# Patient Record
Sex: Female | Born: 1990 | Hispanic: Yes | Marital: Married | Smoking: Never smoker
Health system: Southern US, Community
[De-identification: ages and names within clinical notes are randomized; demographics above are authoritative.]

## PROBLEM LIST (undated history)

## (undated) DIAGNOSIS — N390 Urinary tract infection, site not specified: Secondary | ICD-10-CM

## (undated) DIAGNOSIS — Z789 Other specified health status: Secondary | ICD-10-CM

## (undated) HISTORY — PX: BREAST SURGERY: SHX581

---

## 2010-09-04 ENCOUNTER — Encounter (INDEPENDENT_AMBULATORY_CARE_PROVIDER_SITE_OTHER): Payer: Self-pay | Admitting: *Deleted

## 2010-09-04 ENCOUNTER — Ambulatory Visit: Payer: Self-pay | Admitting: Internal Medicine

## 2010-09-04 LAB — CONVERTED CEMR LAB
Chlamydia, Swab/Urine, PCR: NEGATIVE
GC Probe Amp, Urine: NEGATIVE

## 2017-12-26 LAB — OB RESULTS CONSOLE RUBELLA ANTIBODY, IGM: RUBELLA: IMMUNE

## 2017-12-26 LAB — OB RESULTS CONSOLE HEPATITIS B SURFACE ANTIGEN: HEP B S AG: NEGATIVE

## 2017-12-26 LAB — OB RESULTS CONSOLE GC/CHLAMYDIA
CHLAMYDIA, DNA PROBE: NEGATIVE
Gonorrhea: NEGATIVE

## 2017-12-26 LAB — OB RESULTS CONSOLE ABO/RH: RH Type: POSITIVE

## 2017-12-26 LAB — OB RESULTS CONSOLE RPR: RPR: NONREACTIVE

## 2017-12-26 LAB — OB RESULTS CONSOLE ANTIBODY SCREEN: Antibody Screen: NEGATIVE

## 2017-12-27 ENCOUNTER — Other Ambulatory Visit (HOSPITAL_COMMUNITY): Payer: Self-pay | Admitting: Nurse Practitioner

## 2017-12-27 DIAGNOSIS — Z3A13 13 weeks gestation of pregnancy: Secondary | ICD-10-CM

## 2017-12-27 DIAGNOSIS — Z3682 Encounter for antenatal screening for nuchal translucency: Secondary | ICD-10-CM

## 2018-01-02 ENCOUNTER — Encounter (HOSPITAL_COMMUNITY): Payer: Self-pay | Admitting: Nurse Practitioner

## 2018-01-08 ENCOUNTER — Encounter (HOSPITAL_COMMUNITY): Payer: Self-pay | Admitting: *Deleted

## 2018-01-10 ENCOUNTER — Ambulatory Visit (HOSPITAL_COMMUNITY)
Admission: RE | Admit: 2018-01-10 | Discharge: 2018-01-10 | Disposition: A | Discharge status: Home / Self Care | Payer: 59 | Source: Ambulatory Visit | Attending: Nurse Practitioner | Admitting: Nurse Practitioner

## 2018-01-10 ENCOUNTER — Encounter (HOSPITAL_COMMUNITY): Payer: Self-pay

## 2018-01-10 DIAGNOSIS — Z3A13 13 weeks gestation of pregnancy: Secondary | ICD-10-CM

## 2018-01-10 DIAGNOSIS — Z3682 Encounter for antenatal screening for nuchal translucency: Secondary | ICD-10-CM | POA: Diagnosis present

## 2018-01-10 HISTORY — DX: Urinary tract infection, site not specified: N39.0

## 2018-01-10 HISTORY — DX: Other specified health status: Z78.9

## 2018-01-15 ENCOUNTER — Other Ambulatory Visit: Payer: Self-pay

## 2018-06-13 LAB — OB RESULTS CONSOLE GBS: GBS: NEGATIVE

## 2018-06-13 LAB — OB RESULTS CONSOLE GC/CHLAMYDIA
Chlamydia: NEGATIVE
Gonorrhea: NEGATIVE

## 2018-07-22 ENCOUNTER — Telehealth (HOSPITAL_COMMUNITY): Payer: Self-pay | Admitting: *Deleted

## 2018-07-22 ENCOUNTER — Encounter (HOSPITAL_COMMUNITY): Payer: Self-pay | Admitting: *Deleted

## 2018-07-22 ENCOUNTER — Other Ambulatory Visit: Payer: Self-pay | Admitting: Advanced Practice Midwife

## 2018-07-22 NOTE — Telephone Encounter (Signed)
Preadmission screen  

## 2018-07-26 ENCOUNTER — Other Ambulatory Visit: Payer: Self-pay

## 2018-07-26 ENCOUNTER — Encounter (HOSPITAL_COMMUNITY): Payer: Self-pay

## 2018-07-26 ENCOUNTER — Inpatient Hospital Stay (HOSPITAL_COMMUNITY)
Admission: RE | Admit: 2018-07-26 | Discharge: 2018-07-30 | DRG: 788 | Disposition: A | Discharge status: Home / Self Care | Payer: 59 | Attending: Obstetrics & Gynecology | Admitting: Obstetrics & Gynecology

## 2018-07-26 DIAGNOSIS — Z3490 Encounter for supervision of normal pregnancy, unspecified, unspecified trimester: Secondary | ICD-10-CM

## 2018-07-26 DIAGNOSIS — O480 Post-term pregnancy: Secondary | ICD-10-CM | POA: Diagnosis present

## 2018-07-26 DIAGNOSIS — Z3A41 41 weeks gestation of pregnancy: Secondary | ICD-10-CM

## 2018-07-26 LAB — TYPE AND SCREEN
ABO/RH(D): O POS
ANTIBODY SCREEN: NEGATIVE

## 2018-07-26 LAB — CBC
HEMATOCRIT: 31.3 % — AB (ref 36.0–46.0)
HEMOGLOBIN: 10.3 g/dL — AB (ref 12.0–15.0)
MCH: 31.9 pg (ref 26.0–34.0)
MCHC: 32.9 g/dL (ref 30.0–36.0)
MCV: 96.9 fL (ref 78.0–100.0)
Platelets: 263 10*3/uL (ref 150–400)
RBC: 3.23 MIL/uL — ABNORMAL LOW (ref 3.87–5.11)
RDW: 14.9 % (ref 11.5–15.5)
WBC: 11.7 10*3/uL — ABNORMAL HIGH (ref 4.0–10.5)

## 2018-07-26 LAB — RPR: RPR: NONREACTIVE

## 2018-07-26 LAB — ABO/RH: ABO/RH(D): O POS

## 2018-07-26 MED ORDER — OXYCODONE-ACETAMINOPHEN 5-325 MG PO TABS: 1.0000 | ORAL_TABLET | ORAL | Status: DC | PRN

## 2018-07-26 MED ORDER — LACTATED RINGERS AMNIOINFUSION
INTRAVENOUS | Status: DC
  Administered 2018-07-26 – 2018-07-27 (×2): via INTRAUTERINE

## 2018-07-26 MED ORDER — FENTANYL CITRATE (PF) 100 MCG/2ML IJ SOLN
100.0000 ug | INTRAMUSCULAR | Status: DC | PRN
  Administered 2018-07-26 (×4): 100 ug via INTRAVENOUS
  Filled 2018-07-26 (×4): qty 2

## 2018-07-26 MED ORDER — LACTATED RINGERS IV SOLN: 500.0000 mL | INTRAVENOUS | Status: DC | PRN

## 2018-07-26 MED ORDER — ACETAMINOPHEN 325 MG PO TABS: 650.0000 mg | ORAL_TABLET | ORAL | Status: DC | PRN

## 2018-07-26 MED ORDER — OXYCODONE-ACETAMINOPHEN 5-325 MG PO TABS: 2.0000 | ORAL_TABLET | ORAL | Status: DC | PRN

## 2018-07-26 MED ORDER — TERBUTALINE SULFATE 1 MG/ML IJ SOLN
0.2500 mg | Freq: Once | INTRAMUSCULAR | Status: AC | PRN
  Administered 2018-07-27: 0.25 mg via SUBCUTANEOUS
  Filled 2018-07-26: qty 1

## 2018-07-26 MED ORDER — LIDOCAINE HCL (PF) 1 % IJ SOLN: 30.0000 mL | INTRAMUSCULAR | Status: DC | PRN

## 2018-07-26 MED ORDER — ONDANSETRON HCL 4 MG/2ML IJ SOLN
4.0000 mg | Freq: Four times a day (QID) | INTRAMUSCULAR | Status: DC | PRN
  Administered 2018-07-26 – 2018-07-27 (×2): 4 mg via INTRAVENOUS
  Filled 2018-07-26 (×2): qty 2

## 2018-07-26 MED ORDER — OXYTOCIN 40 UNITS IN LACTATED RINGERS INFUSION - SIMPLE MED
1.0000 m[IU]/min | INTRAVENOUS | Status: DC
  Administered 2018-07-26: 2 m[IU]/min via INTRAVENOUS
  Filled 2018-07-26: qty 1000

## 2018-07-26 MED ORDER — LACTATED RINGERS IV SOLN
INTRAVENOUS | Status: DC
  Administered 2018-07-26 – 2018-07-27 (×7): via INTRAVENOUS

## 2018-07-26 MED ORDER — OXYTOCIN BOLUS FROM INFUSION: 500.0000 mL | Freq: Once | INTRAVENOUS | Status: DC

## 2018-07-26 MED ORDER — OXYTOCIN 40 UNITS IN LACTATED RINGERS INFUSION - SIMPLE MED: 2.5000 [IU]/h | INTRAVENOUS | Status: DC

## 2018-07-26 MED ORDER — MISOPROSTOL 50MCG HALF TABLET
50.0000 ug | ORAL_TABLET | ORAL | Status: DC | PRN
  Administered 2018-07-26 (×3): 50 ug via BUCCAL
  Filled 2018-07-26 (×3): qty 1

## 2018-07-26 MED ORDER — ZOLPIDEM TARTRATE 5 MG PO TABS: 5.0000 mg | ORAL_TABLET | Freq: Every evening | ORAL | Status: DC | PRN

## 2018-07-26 MED ORDER — SOD CITRATE-CITRIC ACID 500-334 MG/5ML PO SOLN
30.0000 mL | ORAL | Status: DC | PRN
  Administered 2018-07-27: 30 mL via ORAL
  Filled 2018-07-26 (×3): qty 15

## 2018-07-26 NOTE — Anesthesia Pain Management Evaluation Note (Signed)
  CRNA Pain Management Visit Note  Patient: Jennifer Acosta, 27 y.o., female  "Hello I am a member of the anesthesia team at [facility]. We have an anesthesia team available at all times to provide care throughout the hospital, including epidural management and anesthesia for C-section. I don't know your plan for the delivery whether it a natural birth, water birth, IV sedation, nitrous supplementation, doula or epidural, but we want to meet your pain goals."   1.Was your pain managed to your expectations on prior hospitalizations?   No   2.What is your expectation for pain management during this hospitalization?     Labor support without medications  3.How can we help you reach that goal? Patient would like natural   Record the patient's initial score and the patient's pain goal.   Pain: 6  Pain Goal: 10 [facility] wants you to be able to say your pain was always managed very well.  Rica RecordsICKELTON,Nadine Ryle 07/26/2018

## 2018-07-26 NOTE — H&P (Addendum)
OBSTETRIC ADMISSION HISTORY AND PHYSICAL  Jennifer Acosta is a 27 y.o. female G1P0 with IUP at 7080w1d by first tri U/S presenting for postdates. She reports +FMs, No LOF, no VB, no blurry vision, headaches or peripheral edema, and RUQ pain.  She plans on breastfeeding. She request Nexplanon for birth control. She received her [facility] care at [facility]   Dating: By first tri U/S --->  Estimated Date of Delivery: 07/18/18  Sono:  @[redacted]w[redacted]d , CWD, normal anatomy, presentation not recorded, lie not recorded, not recorded% EFW   Prenatal History/Complications:  Past Medical History: Past Medical History:  Diagnosis Date  . Medical history non-contributory   . UTI (urinary tract infection)     Past Surgical History: Past Surgical History:  Procedure Laterality Date  . BREAST SURGERY      Obstetrical History: OB History    Gravida  1   Para      Term      Preterm      AB      Living  0     SAB      TAB      Ectopic      Multiple      Live Births              Social History: Social History   Socioeconomic History  . Marital status: Single    Spouse name: Not on file  . Number of children: Not on file  . Years of education: Not on file  . Highest education level: Not on file  Occupational History  . Not on file  Social Needs  . Financial resource strain: Not hard at all  . Food insecurity:    Worry: Never true    Inability: Never true  . Transportation needs:    Medical: No    Non-medical: No  Tobacco Use  . Smoking status: Never Smoker  . Smokeless tobacco: Never Used  Substance and Sexual Activity  . Alcohol use: Not Currently    Frequency: Never  . Drug use: No  . Sexual activity: Yes    Birth control/protection: None  Lifestyle  . Physical activity:    Days per week: Not on file    Minutes per session: Not on file  . Stress: Not at all  Relationships  . Social connections:    Talks on phone: Not on file    Gets together: Not on file     Attends religious service: Not on file    Active member of club or organization: Not on file    Attends meetings of clubs or organizations: Not on file    Relationship status: Not on file  Other Topics Concern  . Not on file  Social History Narrative  . Not on file    Family History: Family History  Problem Relation Age of Onset  . Hypertension Mother     Allergies: No Known Allergies  Medications Prior to Admission  Medication Sig Dispense Refill Last Dose  . Prenatal Multivit-Min-Fe-FA (PRENATAL VITAMINS PO) Take by mouth.   Taking     Review of Systems   All systems reviewed and negative except as stated in HPI  Blood pressure 121/67, pulse 75, temperature 98.3 F (36.8 C), temperature source Oral, resp. rate 16, height 5\' 2"  (1.575 m), weight 67.5 kg, last menstrual period 10/11/2017. General appearance: alert, cooperative, appears stated age and no distress Lungs: clear to auscultation bilaterally Presentation: unsure Fetal monitoringBaseline: 150 bpm, Variability: Good {> 6 bpm),  Accelerations: Reactive and Decelerations: Variable: mild Uterine activity Irritability  Dilation: Fingertip Effacement (%): Thick Station: -3 Exam by:: e. poore ,rnc Prenatal labs: ABO, Rh: --/--/O POS (08/31 0104) Antibody: NEG (08/31 0104) Rubella: Immune (01/31 0000) RPR: Nonreactive (01/31 0000)  HBsAg: Negative (01/31 0000)  HIV:   NR GBS: Negative (07/19 0000)  1 hr Glucola 108 Genetic screening  Norm Anatomy US fetal pelviectasis   Prenatal Transfer Tool  Maternal Diabetes: No Genetic Screening: Normal Maternal Ultrasounds/Referrals: Abnormal:  Findings:   Other: Fetal pelviectasis  Fetal Ultrasounds or other Referrals:  None Maternal Substance Abuse:  No Significant Maternal Medications:  None Significant Maternal Lab Results: Lab values include: Group B Strep negative  Results for orders placed or performed during the hospital encounter of 07/26/18 (from the past  24 hour(s))  CBC   Collection Time: 07/26/18  1:04 AM  Result Value Ref Range   WBC 11.7 (H) 4.0 - 10.5 K/uL   RBC 3.23 (L) 3.87 - 5.11 MIL/uL   Hemoglobin 10.3 (L) 12.0 - 15.0 g/dL   HCT 60.4 (L) 54.0 - 98.1 %   MCV 96.9 78.0 - 100.0 fL   MCH 31.9 26.0 - 34.0 pg   MCHC 32.9 30.0 - 36.0 g/dL   RDW 19.1 47.8 - 29.5 %   Platelets 263 150 - 400 K/uL  Type and screen   Collection Time: 07/26/18  1:04 AM  Result Value Ref Range   ABO/RH(D) O POS    Antibody Screen NEG    Sample Expiration      07/29/2018 Performed at [facility], [address], [place], [place] [address]     Patient Active Problem List   Diagnosis Date Noted  . Encounter for induction of labor 07/26/2018  . Post-dates pregnancy 07/26/2018  . Post term pregnancy over 40 weeks 07/26/2018    Assessment/Plan:  Jennifer Acosta is a 27 y.o. G1P0 at [redacted]w[redacted]d here for IOL due to postdates.   #Labor: Fingertip cervix, 1st dose of cytotec given.  #Pain: Does not want epidural at this point; open to IV fentanyl and nitrous if need be.  #FWB: Cat II tracing.  #ID:  GBS neg  #MOF: Breast #MOC: nexplanon #Circ:  N/A (girl)                     Raynelle Highland, Medical Student  07/26/2018, 2:23 AM                  I personally saw and evaluated the patient, performing the key elements of the service. I developed and verified the management plan that is described in the medical student's note, and I agree with the content with my edits above.                                                              Assessment/plan: IOL of postdates, starting w/ buccal cytotek, patient open to FB late if appropriate Pain control: patient declines epidural at this time, fentanyl/nitro offered prn GBS neg No other known complications  Girl/breast/nexplanon  Dr. Marthenia Rolling, PGY2 [facility] 07/26/2018 2:34 AM   Attestation: I have seen this patient and agree with the resident and medical student's  documentation. We have discussed the plan of care.  Lambert Mody. Juleen China, DO OB/GYN Fellow

## 2018-07-26 NOTE — Progress Notes (Signed)
Pt sitting at bedside.  ? Variables on interference with fhr tracing due to movement

## 2018-07-26 NOTE — Progress Notes (Signed)
Patient ID: Frankey ShownWendy Salgado-Romero, female   DOB: [birth year], 27 y.o.   MRN: [id number]  Feeling some cramping; s/p cytotec x 4 doses  BP 113/78, P66 FHR 120s, +accels, occ mi variables Ctx q 4 mins Cx 4+/80/vtx -1; cervical foley was in vagina  IUP@term  Cx favorable  Will start Pit at approx 1845 Anticipate SVD  Cam HaiSHAW, KIMBERLY CNM 07/26/2018 5:56 PM

## 2018-07-26 NOTE — Progress Notes (Signed)
Pincus BadderK Shaw CNM notified pt contracting too frequently for 0930 dose of cytotec

## 2018-07-26 NOTE — Progress Notes (Signed)
LABOR PROGRESS NOTE  Frankey ShownWendy Acosta is a 27 y.o. G1P0 at 4030w1d  admitted for IOL for postdates   Subjective: Patient doing well, patient comfortable reports mild cramping   Objective: BP 114/79   Pulse (!) 57   Temp 97.8 F (36.6 C)   Resp 18   Ht 5\' 2"  (1.575 m)   Wt 67.5 kg   LMP 10/11/2017   BMI 27.22 kg/m  or  Vitals:   07/26/18 0430 07/26/18 0527 07/26/18 0645 07/26/18 0740  BP: 100/72 113/74 100/61 114/79  Pulse: 87 71 80 (!) 57  Resp: 16 16 16 18   Temp: 97.7 F (36.5 C)   97.8 F (36.6 C)  TempSrc: Oral     Weight:      Height:        FB placed @0700 , SROM at placement of FB- clear fluid  Dilation: 1 Effacement (%): 50 Cervical Position: Posterior Station: -3 Presentation: Vertex Exam by:: e. poore, rnc FHT: baseline rate 130, moderate varibility, +acel, no decel Toco: 1-3/ mild by palpation   Labs: Lab Results  Component Value Date   WBC 11.7 (H) 07/26/2018   HGB 10.3 (L) 07/26/2018   HCT 31.3 (L) 07/26/2018   MCV 96.9 07/26/2018   PLT 263 07/26/2018    Patient Active Problem List   Diagnosis Date Noted  . Encounter for induction of labor 07/26/2018  . Post-dates pregnancy 07/26/2018  . Post term pregnancy over 40 weeks 07/26/2018    Assessment / Plan: 27 y.o. G1P0 at 6930w1d here for IOL for PD   Labor: FB placed @0700 , SROM on placement  Fetal Wellbeing:  Cat I Pain Control:  Medication ordered PRN  Anticipated MOD:  SVD  Sharyon CableRogers, Dorma Altman C, CNM 07/26/2018, 8:31 AM

## 2018-07-26 NOTE — Progress Notes (Signed)
Patient ID: Jennifer Acosta, female   DOB: [birth year], 27 y.o.   MRN: [id number]  Getting Fentanyl for pain; coping well  BP 111/53, P 63 FHR 120s, +accels, early variables, +SS Ctx q 3-5 mins without Pit Cx 7/80/-1; IUPC inserted  IUP@term  Active labor GBS neg  Probably doesn't need Pit due to cx change; may start amnioinfusion for variables Anticipate SVD  Cam HaiSHAW, Kolbee Bogusz CNM 07/26/2018 8:49 PM

## 2018-07-26 NOTE — Progress Notes (Signed)
Patient ID: Jennifer Acosta, female   DOB: Feb 12, 1991, 27 y.o.   MRN: [id number]  Pt ambulating in room; feeling some cramping; cervical foley still in place; last cyto at 0530; SROM with foley placement  BP 117/85, other VSS FHR 120s, +accels, no decels (tracing maternal when ambulating) Ctx q 2-3, mild Cx deferred  IUP@term  SROM Cx unfavorable GBS neg  Plan Pit when foley comes out  Cam HaiSHAW, Kimarion Chery CNM 07/26/2018 10:16 AM

## 2018-07-27 ENCOUNTER — Inpatient Hospital Stay (HOSPITAL_COMMUNITY): Payer: 59 | Admitting: Anesthesiology

## 2018-07-27 ENCOUNTER — Encounter (HOSPITAL_COMMUNITY): Payer: Self-pay

## 2018-07-27 ENCOUNTER — Encounter (HOSPITAL_COMMUNITY)
Admission: RE | Disposition: A | Discharge status: Home / Self Care | Payer: Self-pay | Source: Home / Self Care | Attending: Obstetrics & Gynecology

## 2018-07-27 DIAGNOSIS — O480 Post-term pregnancy: Secondary | ICD-10-CM

## 2018-07-27 DIAGNOSIS — Z3A41 41 weeks gestation of pregnancy: Secondary | ICD-10-CM

## 2018-07-27 SURGERY — Surgical Case
Anesthesia: Regional | Wound class: Clean Contaminated

## 2018-07-27 MED ORDER — OXYTOCIN 40 UNITS IN LACTATED RINGERS INFUSION - SIMPLE MED
1.0000 m[IU]/min | INTRAVENOUS | Status: DC
  Administered 2018-07-27: 1 m[IU]/min via INTRAVENOUS

## 2018-07-27 MED ORDER — MEPERIDINE HCL 25 MG/ML IJ SOLN: 6.2500 mg | INTRAMUSCULAR | Status: DC | PRN

## 2018-07-27 MED ORDER — COCONUT OIL OIL: 1.0000 "application " | TOPICAL_OIL | Status: DC | PRN

## 2018-07-27 MED ORDER — LACTATED RINGERS IV SOLN: INTRAVENOUS | Status: DC

## 2018-07-27 MED ORDER — HYDROMORPHONE HCL 1 MG/ML IJ SOLN: 0.2500 mg | INTRAMUSCULAR | Status: DC | PRN

## 2018-07-27 MED ORDER — BUPIVACAINE HCL (PF) 0.25 % IJ SOLN
INTRAMUSCULAR | Status: DC | PRN
  Administered 2018-07-27 (×2): 5 mL via EPIDURAL

## 2018-07-27 MED ORDER — CEFAZOLIN SODIUM-DEXTROSE 2-3 GM-%(50ML) IV SOLR
INTRAVENOUS | Status: DC | PRN
  Administered 2018-07-27: 2 g via INTRAVENOUS

## 2018-07-27 MED ORDER — NALOXONE HCL 4 MG/10ML IJ SOLN
1.0000 ug/kg/h | INTRAVENOUS | Status: DC | PRN
  Filled 2018-07-27: qty 5

## 2018-07-27 MED ORDER — NALBUPHINE HCL 10 MG/ML IJ SOLN: 5.0000 mg | INTRAMUSCULAR | Status: DC | PRN

## 2018-07-27 MED ORDER — MORPHINE SULFATE (PF) 0.5 MG/ML IJ SOLN
INTRAMUSCULAR | Status: AC
  Filled 2018-07-27: qty 10

## 2018-07-27 MED ORDER — SIMETHICONE 80 MG PO CHEW
80.0000 mg | CHEWABLE_TABLET | Freq: Three times a day (TID) | ORAL | Status: DC
  Administered 2018-07-27 – 2018-07-29 (×7): 80 mg via ORAL
  Filled 2018-07-27 (×8): qty 1

## 2018-07-27 MED ORDER — OXYTOCIN 40 UNITS IN LACTATED RINGERS INFUSION - SIMPLE MED
1.0000 m[IU]/min | INTRAVENOUS | Status: DC
  Administered 2018-07-27: 2 m[IU]/min via INTRAVENOUS

## 2018-07-27 MED ORDER — SCOPOLAMINE 1 MG/3DAYS TD PT72
MEDICATED_PATCH | TRANSDERMAL | Status: AC
  Filled 2018-07-27: qty 1

## 2018-07-27 MED ORDER — FENTANYL 2.5 MCG/ML BUPIVACAINE 1/10 % EPIDURAL INFUSION (WH - ANES)
14.0000 mL/h | INTRAMUSCULAR | Status: DC | PRN
  Administered 2018-07-27: 14 mL/h via EPIDURAL

## 2018-07-27 MED ORDER — ACETAMINOPHEN 10 MG/ML IV SOLN: 1000.0000 mg | Freq: Once | INTRAVENOUS | Status: DC | PRN

## 2018-07-27 MED ORDER — OXYTOCIN 10 UNIT/ML IJ SOLN
INTRAMUSCULAR | Status: AC
  Filled 2018-07-27: qty 4

## 2018-07-27 MED ORDER — TERBUTALINE SULFATE 1 MG/ML IJ SOLN
INTRAMUSCULAR | Status: AC
  Filled 2018-07-27: qty 1

## 2018-07-27 MED ORDER — METOCLOPRAMIDE HCL 5 MG/ML IJ SOLN
INTRAMUSCULAR | Status: DC | PRN
  Administered 2018-07-27: 10 mg via INTRAVENOUS

## 2018-07-27 MED ORDER — PHENYLEPHRINE 40 MCG/ML (10ML) SYRINGE FOR IV PUSH (FOR BLOOD PRESSURE SUPPORT)
PREFILLED_SYRINGE | INTRAVENOUS | Status: AC
  Filled 2018-07-27: qty 10

## 2018-07-27 MED ORDER — FENTANYL CITRATE (PF) 100 MCG/2ML IJ SOLN
INTRAMUSCULAR | Status: AC
  Filled 2018-07-27: qty 2

## 2018-07-27 MED ORDER — FENTANYL 2.5 MCG/ML BUPIVACAINE 1/10 % EPIDURAL INFUSION (WH - ANES)
INTRAMUSCULAR | Status: AC
  Filled 2018-07-27: qty 100

## 2018-07-27 MED ORDER — ZOLPIDEM TARTRATE 5 MG PO TABS: 5.0000 mg | ORAL_TABLET | Freq: Every evening | ORAL | Status: DC | PRN

## 2018-07-27 MED ORDER — DIBUCAINE 1 % RE OINT: 1.0000 "application " | TOPICAL_OINTMENT | RECTAL | Status: DC | PRN

## 2018-07-27 MED ORDER — MORPHINE SULFATE (PF) 0.5 MG/ML IJ SOLN
INTRAMUSCULAR | Status: DC | PRN
  Administered 2018-07-27: 3 mg via EPIDURAL

## 2018-07-27 MED ORDER — KETOROLAC TROMETHAMINE 30 MG/ML IJ SOLN: 30.0000 mg | Freq: Four times a day (QID) | INTRAMUSCULAR | Status: AC | PRN

## 2018-07-27 MED ORDER — PRENATAL MULTIVITAMIN CH
1.0000 | ORAL_TABLET | Freq: Every day | ORAL | Status: DC
  Administered 2018-07-27 – 2018-07-29 (×3): 1 via ORAL
  Filled 2018-07-27 (×4): qty 1

## 2018-07-27 MED ORDER — PHENYLEPHRINE 40 MCG/ML (10ML) SYRINGE FOR IV PUSH (FOR BLOOD PRESSURE SUPPORT): 80.0000 ug | PREFILLED_SYRINGE | INTRAVENOUS | Status: DC | PRN

## 2018-07-27 MED ORDER — TERBUTALINE SULFATE 1 MG/ML IJ SOLN
0.2500 mg | Freq: Once | INTRAMUSCULAR | Status: AC
  Administered 2018-07-27: 0.25 mg via SUBCUTANEOUS

## 2018-07-27 MED ORDER — DIPHENHYDRAMINE HCL 50 MG/ML IJ SOLN: 12.5000 mg | INTRAMUSCULAR | Status: DC | PRN

## 2018-07-27 MED ORDER — BUPIVACAINE HCL (PF) 0.5 % IJ SOLN
INTRAMUSCULAR | Status: DC | PRN
  Administered 2018-07-27: 30 mL

## 2018-07-27 MED ORDER — MEPERIDINE HCL 25 MG/ML IJ SOLN
INTRAMUSCULAR | Status: AC
  Filled 2018-07-27: qty 1

## 2018-07-27 MED ORDER — OXYTOCIN 40 UNITS IN LACTATED RINGERS INFUSION - SIMPLE MED: 2.5000 [IU]/h | INTRAVENOUS | Status: AC

## 2018-07-27 MED ORDER — SIMETHICONE 80 MG PO CHEW
80.0000 mg | CHEWABLE_TABLET | ORAL | Status: DC
  Administered 2018-07-28 – 2018-07-30 (×3): 80 mg via ORAL
  Filled 2018-07-27 (×3): qty 1

## 2018-07-27 MED ORDER — SIMETHICONE 80 MG PO CHEW: 80.0000 mg | CHEWABLE_TABLET | ORAL | Status: DC | PRN

## 2018-07-27 MED ORDER — CEFAZOLIN SODIUM-DEXTROSE 2-4 GM/100ML-% IV SOLN
2.0000 g | INTRAVENOUS | Status: AC
  Filled 2018-07-27: qty 100

## 2018-07-27 MED ORDER — DEXAMETHASONE SODIUM PHOSPHATE 4 MG/ML IJ SOLN
INTRAMUSCULAR | Status: AC
  Filled 2018-07-27: qty 1

## 2018-07-27 MED ORDER — OXYCODONE-ACETAMINOPHEN 5-325 MG PO TABS
1.0000 | ORAL_TABLET | ORAL | Status: DC | PRN
  Administered 2018-07-29: 1 via ORAL
  Filled 2018-07-27: qty 1

## 2018-07-27 MED ORDER — LIDOCAINE-EPINEPHRINE (PF) 2 %-1:200000 IJ SOLN
INTRAMUSCULAR | Status: AC
  Filled 2018-07-27: qty 20

## 2018-07-27 MED ORDER — PHENYLEPHRINE HCL 10 MG/ML IJ SOLN
INTRAMUSCULAR | Status: DC | PRN
  Administered 2018-07-27: 40 ug via INTRAVENOUS
  Administered 2018-07-27: 80 ug via INTRAVENOUS

## 2018-07-27 MED ORDER — NALBUPHINE HCL 10 MG/ML IJ SOLN: 5.0000 mg | Freq: Once | INTRAMUSCULAR | Status: DC | PRN

## 2018-07-27 MED ORDER — SODIUM CHLORIDE 0.9 % IV SOLN
500.0000 mg | INTRAVENOUS | Status: AC
  Administered 2018-07-27: 500 mg via INTRAVENOUS
  Filled 2018-07-27: qty 500

## 2018-07-27 MED ORDER — LACTATED RINGERS IV SOLN
INTRAVENOUS | Status: DC | PRN
  Administered 2018-07-27: 09:00:00 via INTRAVENOUS

## 2018-07-27 MED ORDER — LIDOCAINE-EPINEPHRINE (PF) 2 %-1:200000 IJ SOLN
INTRAMUSCULAR | Status: DC | PRN
  Administered 2018-07-27: 4 mL via EPIDURAL
  Administered 2018-07-27: 6 mL via EPIDURAL

## 2018-07-27 MED ORDER — ONDANSETRON HCL 4 MG/2ML IJ SOLN
INTRAMUSCULAR | Status: DC | PRN
  Administered 2018-07-27: 4 mg via INTRAVENOUS

## 2018-07-27 MED ORDER — OXYTOCIN 10 UNIT/ML IJ SOLN
INTRAVENOUS | Status: DC | PRN
  Administered 2018-07-27: 40 [IU] via INTRAVENOUS

## 2018-07-27 MED ORDER — MENTHOL 3 MG MT LOZG: 1.0000 | LOZENGE | OROMUCOSAL | Status: DC | PRN

## 2018-07-27 MED ORDER — LACTATED RINGERS IV SOLN
500.0000 mL | Freq: Once | INTRAVENOUS | Status: AC
  Administered 2018-07-27: 500 mL via INTRAVENOUS

## 2018-07-27 MED ORDER — SCOPOLAMINE 1 MG/3DAYS TD PT72
1.0000 | MEDICATED_PATCH | Freq: Once | TRANSDERMAL | Status: DC
  Filled 2018-07-27 (×2): qty 1

## 2018-07-27 MED ORDER — PHENYLEPHRINE 40 MCG/ML (10ML) SYRINGE FOR IV PUSH (FOR BLOOD PRESSURE SUPPORT)
80.0000 ug | PREFILLED_SYRINGE | INTRAVENOUS | Status: DC | PRN
  Administered 2018-07-27: 80 ug via INTRAVENOUS

## 2018-07-27 MED ORDER — BUPIVACAINE HCL (PF) 0.5 % IJ SOLN
INTRAMUSCULAR | Status: AC
  Filled 2018-07-27: qty 30

## 2018-07-27 MED ORDER — SENNOSIDES-DOCUSATE SODIUM 8.6-50 MG PO TABS
2.0000 | ORAL_TABLET | ORAL | Status: DC
  Administered 2018-07-28 – 2018-07-30 (×3): 2 via ORAL
  Filled 2018-07-27 (×3): qty 2

## 2018-07-27 MED ORDER — MEPERIDINE HCL 25 MG/ML IJ SOLN
INTRAMUSCULAR | Status: DC | PRN
  Administered 2018-07-27 (×2): 12.5 mg via INTRAVENOUS

## 2018-07-27 MED ORDER — ONDANSETRON HCL 4 MG/2ML IJ SOLN
INTRAMUSCULAR | Status: AC
  Filled 2018-07-27: qty 2

## 2018-07-27 MED ORDER — DIPHENHYDRAMINE HCL 25 MG PO CAPS: 25.0000 mg | ORAL_CAPSULE | ORAL | Status: DC | PRN

## 2018-07-27 MED ORDER — EPHEDRINE 5 MG/ML INJ: 10.0000 mg | INTRAVENOUS | Status: DC | PRN

## 2018-07-27 MED ORDER — OXYCODONE-ACETAMINOPHEN 5-325 MG PO TABS: 2.0000 | ORAL_TABLET | ORAL | Status: DC | PRN

## 2018-07-27 MED ORDER — IBUPROFEN 600 MG PO TABS
600.0000 mg | ORAL_TABLET | Freq: Four times a day (QID) | ORAL | Status: DC
  Administered 2018-07-27 – 2018-07-30 (×12): 600 mg via ORAL
  Filled 2018-07-27 (×13): qty 1

## 2018-07-27 MED ORDER — TETANUS-DIPHTH-ACELL PERTUSSIS 5-2.5-18.5 LF-MCG/0.5 IM SUSP: 0.5000 mL | Freq: Once | INTRAMUSCULAR | Status: DC

## 2018-07-27 MED ORDER — SODIUM BICARBONATE 8.4 % IV SOLN
INTRAVENOUS | Status: DC | PRN
  Administered 2018-07-27 (×2): 3 mL via EPIDURAL
  Administered 2018-07-27: 4 mL via EPIDURAL

## 2018-07-27 MED ORDER — PROMETHAZINE HCL 25 MG/ML IJ SOLN: 6.2500 mg | INTRAMUSCULAR | Status: DC | PRN

## 2018-07-27 MED ORDER — FENTANYL CITRATE (PF) 100 MCG/2ML IJ SOLN
INTRAMUSCULAR | Status: DC | PRN
  Administered 2018-07-27: 100 ug via EPIDURAL

## 2018-07-27 MED ORDER — PHENYLEPHRINE 40 MCG/ML (10ML) SYRINGE FOR IV PUSH (FOR BLOOD PRESSURE SUPPORT)
PREFILLED_SYRINGE | INTRAVENOUS | Status: AC
  Administered 2018-07-27: 80 ug via INTRAVENOUS
  Filled 2018-07-27: qty 10

## 2018-07-27 MED ORDER — LACTATED RINGERS IV SOLN
INTRAVENOUS | Status: DC
  Administered 2018-07-27: 18:00:00 via INTRAVENOUS

## 2018-07-27 MED ORDER — SODIUM CHLORIDE 0.9% FLUSH: 3.0000 mL | INTRAVENOUS | Status: DC | PRN

## 2018-07-27 MED ORDER — ONDANSETRON HCL 4 MG/2ML IJ SOLN: 4.0000 mg | Freq: Three times a day (TID) | INTRAMUSCULAR | Status: DC | PRN

## 2018-07-27 MED ORDER — NALOXONE HCL 0.4 MG/ML IJ SOLN: 0.4000 mg | INTRAMUSCULAR | Status: DC | PRN

## 2018-07-27 MED ORDER — ACETAMINOPHEN 325 MG PO TABS
650.0000 mg | ORAL_TABLET | ORAL | Status: DC | PRN
  Administered 2018-07-28: 650 mg via ORAL
  Filled 2018-07-27: qty 2

## 2018-07-27 MED ORDER — HYDROCODONE-ACETAMINOPHEN 7.5-325 MG PO TABS: 1.0000 | ORAL_TABLET | Freq: Once | ORAL | Status: DC | PRN

## 2018-07-27 MED ORDER — DIPHENHYDRAMINE HCL 25 MG PO CAPS: 25.0000 mg | ORAL_CAPSULE | Freq: Four times a day (QID) | ORAL | Status: DC | PRN

## 2018-07-27 MED ORDER — WITCH HAZEL-GLYCERIN EX PADS: 1.0000 "application " | MEDICATED_PAD | CUTANEOUS | Status: DC | PRN

## 2018-07-27 SURGICAL SUPPLY — 31 items
BARRIER ADHS 3X4 INTERCEED (GAUZE/BANDAGES/DRESSINGS) IMPLANT
BENZOIN TINCTURE PRP APPL 2/3 (GAUZE/BANDAGES/DRESSINGS) ×2 IMPLANT
CHLORAPREP W/TINT 26ML (MISCELLANEOUS) ×2 IMPLANT
CLAMP CORD UMBIL (MISCELLANEOUS) IMPLANT
CLOTH BEACON ORANGE TIMEOUT ST (SAFETY) ×2 IMPLANT
DRSG OPSITE POSTOP 4X10 (GAUZE/BANDAGES/DRESSINGS) ×2 IMPLANT
ELECT REM PT RETURN 9FT ADLT (ELECTROSURGICAL) ×2
ELECTRODE REM PT RTRN 9FT ADLT (ELECTROSURGICAL) ×1 IMPLANT
EXTRACTOR VACUUM KIWI (MISCELLANEOUS) IMPLANT
GAUZE SPONGE 4X4 12PLY STRL (GAUZE/BANDAGES/DRESSINGS) ×2 IMPLANT
GLOVE BIO SURGEON STRL SZ 6.5 (GLOVE) ×2 IMPLANT
GLOVE BIOGEL PI IND STRL 7.0 (GLOVE) ×2 IMPLANT
GLOVE BIOGEL PI INDICATOR 7.0 (GLOVE) ×2
GOWN STRL REUS W/TWL LRG LVL3 (GOWN DISPOSABLE) ×4 IMPLANT
KIT ABG SYR 3ML LUER SLIP (SYRINGE) IMPLANT
NEEDLE HYPO 22GX1.5 SAFETY (NEEDLE) IMPLANT
NEEDLE HYPO 25X5/8 SAFETYGLIDE (NEEDLE) IMPLANT
NS IRRIG 1000ML POUR BTL (IV SOLUTION) ×2 IMPLANT
PACK C SECTION WH (CUSTOM PROCEDURE TRAY) ×2 IMPLANT
PAD OB MATERNITY 4.3X12.25 (PERSONAL CARE ITEMS) ×2 IMPLANT
PENCIL SMOKE EVAC W/HOLSTER (ELECTROSURGICAL) ×2 IMPLANT
RETRACTOR WND ALEXIS 25 LRG (MISCELLANEOUS) IMPLANT
RTRCTR WOUND ALEXIS 25CM LRG (MISCELLANEOUS)
STRIP CLOSURE SKIN 1/2X4 (GAUZE/BANDAGES/DRESSINGS) ×2 IMPLANT
SUT VIC AB 0 CT1 36 (SUTURE) ×12 IMPLANT
SUT VIC AB 2-0 CT1 27 (SUTURE) ×1
SUT VIC AB 2-0 CT1 TAPERPNT 27 (SUTURE) ×1 IMPLANT
SUT VIC AB 4-0 PS2 27 (SUTURE) ×2 IMPLANT
SYR CONTROL 10ML LL (SYRINGE) IMPLANT
TOWEL OR 17X24 6PK STRL BLUE (TOWEL DISPOSABLE) ×2 IMPLANT
TRAY FOLEY W/BAG SLVR 14FR LF (SET/KITS/TRAYS/PACK) IMPLANT

## 2018-07-27 NOTE — Lactation Note (Signed)
This note was copied from a baby's chart. Lactation Consultation Note  Patient Name: [name] OACZY'S Date: 07/27/2018 Reason for consult: Initial assessment;Primapara;1st time breastfeeding;Term  P1 mother whose infant is now 44 hours old.  Baby was awake and showing feeding cues as I entered.  Offered to assist with latch and mother accepted.    Mother's breasts are soft and non tender with everted nipples bilaterally.  Mother shown how to do hand expression.  Mother did a return demonstration and was able to express colostrum drops which I finger fed back to baby.  Attempted to latch in the football hold on the left breast.  Baby was able to latch but did not show any interest in feeding.  Mother stated she fed about an hour ago. Placed baby STS with mother and she fell asleep.  Encouraged feeding 8-12 times/24 hours of sooner if baby shows feeding cues.  Reviewed feeding cues.  Continue to do STS and hand expression before/after feedings.  Mother will feed back any EBM she obtains from hand expression.    Mom made aware of O/P services, breastfeeding support groups, community resources, and our phone # for post-discharge questions.  Family present.   Maternal Data Formula Feeding for Exclusion: No Has patient been taught Hand Expression?: Yes Does the patient have breastfeeding experience prior to this delivery?: No  Feeding Feeding Type: Breast Fed Length of feed: 0 min  LATCH Score Latch: Too sleepy or reluctant, no latch achieved, no sucking elicited.  Audible Swallowing: None  Type of Nipple: Everted at rest and after stimulation  Comfort (Breast/Nipple): Soft / non-tender  Hold (Positioning): Assistance needed to correctly position infant at breast and maintain latch.  LATCH Score: 5  Interventions Interventions: Breast feeding basics reviewed;Assisted with latch;Skin to skin;Breast massage;Hand express;Position options;Support pillows;Adjust  position;Breast compression  Lactation Tools Discussed/Used WIC Program: Yes   Consult Status Consult Status: Follow-up Date: 07/27/18 Follow-up type: In-patient    Knight Oelkers R Dayveon Halley 07/27/2018, 6:06 PM

## 2018-07-27 NOTE — Progress Notes (Signed)
Jennifer Acosta is a 27 y.o. G1P0 at [redacted]w[redacted]d by LMP admitted for induction of labor due to Post dates. Due date 8/23.  Subjective:good pain relief   Objective: BP 109/70   Pulse 93   Temp 98.9 F (37.2 C) (Oral)   Resp 18   Ht 5\' 2"  (1.575 m)   Wt 67.5 kg   LMP 10/11/2017   SpO2 100%   BMI 27.22 kg/m  I/O last 3 completed shifts: In: -  Out: 300 [Emesis/NG output:300] Total I/O In: -  Out: 100 [Urine:100]  FHT:  FHR: 130 bpm, variability: moderate,  accelerations:  Present,  decelerations:  Present episodes of bradycardia UC:   regular, every 3 minutes SVE:   Dilation: 10 Effacement (%): 100 Station: -1 Exam by:: Adelina Mings, RN Reports rim, not complete Labs: Lab Results  Component Value Date   WBC 11.7 (H) 07/26/2018   HGB 10.3 (L) 07/26/2018   HCT 31.3 (L) 07/26/2018   MCV 96.9 07/26/2018   PLT 263 07/26/2018    Assessment / Plan: Arrest in active phase of labor  Labor: fetal intolerance Preeclampsia:  no signs or symptoms of toxicity Fetal Wellbeing:  Category III Pain Control:  Epidural I/D:  n/a Anticipated MOD:  Cesarean section The risks of cesarean section discussed with the patient included but were not limited to: bleeding which may require transfusion or reoperation; infection which may require antibiotics; injury to bowel, bladder, ureters or other surrounding organs; injury to the fetus; need for additional procedures including hysterectomy in the event of a life-threatening hemorrhage; placental abnormalities wth subsequent pregnancies, incisional problems, thromboembolic phenomenon and other postoperative/anesthesia complications. The patient concurred with the proposed plan, giving informed written consent for the procedure.   Patient has been NPO since yesterday she will remain NPO for procedure. Anesthesia and OR aware. Preoperative prophylactic antibiotics and SCDs ordered on call to the OR.  To OR when ready.    Scheryl Darter 07/27/2018, 8:29  AM

## 2018-07-27 NOTE — Anesthesia Procedure Notes (Signed)
Epidural Patient location during procedure: OB Start time: 07/27/2018 2:40 AM End time: 07/27/2018 2:50 AM  Staffing Anesthesiologist: Elmer Picker, MD Performed: anesthesiologist   Preanesthetic Checklist Completed: patient identified, pre-op evaluation, timeout performed, IV checked, risks and benefits discussed and monitors and equipment checked  Epidural Patient position: sitting Prep: site prepped and draped and DuraPrep Patient monitoring: continuous pulse ox, blood pressure, heart rate and cardiac monitor Approach: midline Location: L3-L4 Injection technique: LOR air  Needle:  Needle type: Tuohy  Needle gauge: 17 G Needle length: 9 cm Needle insertion depth: 4.5 cm Catheter type: closed end flexible Catheter size: 19 Gauge Catheter at skin depth: 10 cm Test dose: negative  Assessment Sensory level: T8 Events: blood not aspirated, injection not painful, no injection resistance, negative IV test and no paresthesia  Additional Notes Patient identified. Risks/Benefits/Options discussed with patient including but not limited to bleeding, infection, nerve damage, paralysis, failed block, incomplete pain control, headache, blood pressure changes, nausea, vomiting, reactions to medication both or allergic, itching and postpartum back pain. Confirmed with bedside nurse the patient's most recent platelet count. Confirmed with patient that they are not currently taking any anticoagulation, have any bleeding history or any family history of bleeding disorders. Patient expressed understanding and wished to proceed. All questions were answered. Sterile technique was used throughout the entire procedure. Please see nursing notes for vital signs. Test dose was given through epidural catheter and negative prior to continuing to dose epidural or start infusion. Warning signs of high block given to the patient including shortness of breath, tingling/numbness in hands, complete motor block,  or any concerning symptoms with instructions to call for help. Patient was given instructions on fall risk and not to get out of bed. All questions and concerns addressed with instructions to call with any issues or inadequate analgesia.  Reason for block:procedure for pain

## 2018-07-27 NOTE — Transfer of Care (Addendum)
Immediate Anesthesia Transfer of Care Note  Patient: Jennifer Acosta  Procedure(s) Performed: CESAREAN SECTION (N/A )  Patient Location: PACU  Anesthesia Type:Epidural  Level of Consciousness: awake, alert  and oriented  Airway & Oxygen Therapy: Patient Spontanous Breathing  Post-op Assessment: Report given to RN and Post -op Vital signs reviewed and stable  Post vital signs: Reviewed and stable  Last Vitals:  Vitals Value Taken Time  BP    Temp    Pulse    Resp    SpO2      Last Pain:  Vitals:   07/27/18 1112  TempSrc: Axillary  PainSc:       Patients Stated Pain Goal: 5 (07/26/18 1657)  Complications: No apparent anesthesia complications

## 2018-07-27 NOTE — Progress Notes (Signed)
Patient ID: Jennifer Acosta, female   DOB: [birth year], 27 y.o.   MRN: [id number]  Just had epidural placed- comfortable  BP 101/54, P 97 FHR 145-150, +accels, occ variable Ctx q 6 mins without Pit Cx 8-9cm per RN  IUP@term  Protracted active phase, prob due to inadequate labor  Will restart Pit and watch FHR closely  Jennifer Acosta CNM 07/27/2018 4:00 AM

## 2018-07-27 NOTE — Anesthesia Postprocedure Evaluation (Signed)
Anesthesia Post Note  Patient: Jennifer Acosta  Procedure(s) Performed: CESAREAN SECTION (N/A )     Patient location during evaluation: Mother Baby Anesthesia Type: Epidural Level of consciousness: awake and alert Pain management: pain level controlled Vital Signs Assessment: post-procedure vital signs reviewed and stable Respiratory status: spontaneous breathing, nonlabored ventilation and respiratory function stable Cardiovascular status: stable Postop Assessment: no headache, no backache and epidural receding Anesthetic complications: no    Last Vitals:  Vitals:   07/27/18 1430 07/27/18 1600  BP: 121/69 115/61  Pulse: 100 88  Resp: 18 16  Temp: 36.8 C 36.7 C  SpO2:      Last Pain:  Vitals:   07/27/18 1600  TempSrc: Oral  PainSc:    Pain Goal: Patients Stated Pain Goal: 5 (07/26/18 1657)               Emmaline Kluver N

## 2018-07-27 NOTE — Op Note (Signed)
Jennifer Acosta PROCEDURE DATE: 07/27/2018  PREOPERATIVE DIAGNOSES: Intrauterine pregnancy at [redacted]w[redacted]d weeks gestation; fetal intolerance of labor  POSTOPERATIVE DIAGNOSES: The same  PROCEDURE: Primary Low Transverse Cesarean Section  SURGEON:  Adam Phenix, MD  ASSISTANT:  none  ANESTHESIOLOGY TEAM: Anesthesiologist: Trevor Iha, MD; Elmer Picker, MD  INDICATIONS: Jennifer Acosta is a 27 y.o. G1P1001 at [redacted]w[redacted]d here for cesarean section secondary to the indications listed under preoperative diagnoses; please see preoperative note for further details.  The risks of cesarean section were discussed with the patient including but were not limited to: bleeding which may require transfusion or reoperation; infection which may require antibiotics; injury to bowel, bladder, ureters or other surrounding organs; injury to the fetus; need for additional procedures including hysterectomy in the event of a life-threatening hemorrhage; placental abnormalities wth subsequent pregnancies, incisional problems, thromboembolic phenomenon and other postoperative/anesthesia complications.   The patient concurred with the proposed plan, giving informed written consent for the procedure.    FINDINGS:  Viable female infant in cephalic presentation.  Apgars 8 and 9.  meconium amniotic fluid.  Intact placenta, three vessel cord.  Normal uterus, fallopian tubes and ovaries bilaterally.  ANESTHESIA: Epidural  INTRAVENOUS FLUIDS: 1200 ml   ESTIMATED BLOOD LOSS: 800 ml URINE OUTPUT:  100 ml SPECIMENS: Placenta sent to L&D COMPLICATIONS: None immediate  PROCEDURE IN DETAIL:  The patient preoperatively received intravenous antibiotics and had sequential compression devices applied to her lower extremities.  She was then taken to the operating room where the epidural anesthesia was dosed up to surgical level and was found to be adequate. She was then placed in a dorsal supine position with a leftward  tilt, and prepped and draped in a sterile manner.  A foley catheter was placed into her bladder and attached to constant gravity.  After an adequate timeout was performed, a Pfannenstiel skin incision was made with scalpel and carried through to the underlying layer of fascia. The fascia was incised in the midline, and this incision was extended bilaterally using the Mayo scissors.  Kocher clamps were applied to the superior aspect of the fascial incision and the underlying rectus muscles were dissected off bluntly.  A similar process was carried out on the inferior aspect of the fascial incision. The rectus muscles were separated in the midline and the peritoneum was entered bluntly. The Alexis self-retaining retractor was introduced into the abdominal cavity.  Attention was turned to the lower uterine segment where a low transverse hysterotomy was made with a scalpel and extended bilaterally bluntly.  The infant was successfully delivered, the cord was clamped and cut after one minute, and the infant was handed over to the awaiting neonatology team. Uterine massage was then administered, and the placenta delivered intact with a three-vessel cord. The uterus was then cleared of clots and debris.  The hysterotomy was closed with 0 Vicryl in a running locked fashion, and an imbricating layer was also placed with 0 Vicryl.  Figure-of-eight 0 Vicryl serosal stitches were placed to help with hemostasis.  The pelvis was cleared of all clot and debris. Hemostasis was confirmed on all surfaces.  The retractor was removed.  The peritoneum was closed with a 2-0 Vicryl running stitch and the rectus muscles were reapproximated using 0 Vicryl interrupted stitches. The fascia was then closed using 0 Vicryl.  The subcutaneous layer was irrigated, and the skin was closed with a 4-0 Vicryl subcuticular stitch. The patient tolerated the procedure well. Sponge, instrument and needle counts were  correct x 3.  She was taken to the  recovery room in stable condition.    Adam Phenix, MD Obstetrician & Gynecologist, [facility] for [facility], [facility]

## 2018-07-27 NOTE — Progress Notes (Signed)
Patient ID: Jennifer Acosta, female   DOB: 1991-01-12, 27 y.o.   MRN: [id number]  CTSP for FHR decel to 70s from [phone] with slow return to baseline after hands and knees positioning, O2, fluid bolus, and amnioinfusion. Dr Emelda Fear in to eval- given Terb with obs of FHR tracing x 30 mins before evaluating need for Pit. Pit has been off b/c pt has been changing her cx spontaneously, but now labor appears to have slowed as cx was last 7cm at 2030. Concerned about FHR tracing if Pit needs to be started- will watch closely. Now has IUPC and IFSE as was difficult to distinguish maternal from fetal heartrates. Pt still desires to labor without epidural and is doing well.  Cam Hai CNM 07/27/2018

## 2018-07-27 NOTE — Anesthesia Preprocedure Evaluation (Signed)
Anesthesia Evaluation  Patient identified by MRN, date of birth, ID band Patient awake    Reviewed: Allergy & Precautions, NPO status , Patient's Chart, lab work & pertinent test results  Airway Mallampati: II  TM Distance: >3 FB Neck ROM: Full    Dental no notable dental hx.    Pulmonary    Pulmonary exam normal breath sounds clear to auscultation       Cardiovascular Normal cardiovascular exam Rhythm:Regular Rate:Normal     Neuro/Psych negative neurological ROS  negative psych ROS   GI/Hepatic negative GI ROS, Neg liver ROS,   Endo/Other  negative endocrine ROS  Renal/GU negative Renal ROS  negative genitourinary   Musculoskeletal negative musculoskeletal ROS (+)   Abdominal   Peds  Hematology negative hematology ROS (+)   Anesthesia Other Findings   Reproductive/Obstetrics (+) Pregnancy                             Anesthesia Physical Anesthesia Plan  ASA: II  Anesthesia Plan: Epidural   Post-op Pain Management:    Induction:   PONV Risk Score and Plan: Treatment may vary due to age or medical condition  Airway Management Planned: Natural Airway  Additional Equipment:   Intra-op Plan:   Post-operative Plan:   Informed Consent: I have reviewed the patients History and Physical, chart, labs and discussed the procedure including the risks, benefits and alternatives for the proposed anesthesia with the patient or authorized representative who has indicated his/her understanding and acceptance.     Plan Discussed with: Anesthesiologist  Anesthesia Plan Comments: (Patient identified. Risks, benefits, options discussed with patient including but not limited to bleeding, infection, nerve damage, paralysis, failed block, incomplete pain control, headache, blood pressure changes, nausea, vomiting, reactions to medication, itching, and post partum back pain. Confirmed with bedside  nurse the patient's most recent platelet count. Confirmed with the patient that they are not taking any anticoagulation, have any bleeding history or any family history of bleeding disorders. Patient expressed understanding and wishes to proceed. All questions were answered. )        Anesthesia Quick Evaluation  

## 2018-07-28 LAB — CBC
HEMATOCRIT: 23.5 % — AB (ref 36.0–46.0)
Hemoglobin: 7.9 g/dL — ABNORMAL LOW (ref 12.0–15.0)
MCH: 32.1 pg (ref 26.0–34.0)
MCHC: 33.6 g/dL (ref 30.0–36.0)
MCV: 95.5 fL (ref 78.0–100.0)
PLATELETS: 206 10*3/uL (ref 150–400)
RBC: 2.46 MIL/uL — ABNORMAL LOW (ref 3.87–5.11)
RDW: 15.7 % — AB (ref 11.5–15.5)
WBC: 25.3 10*3/uL — ABNORMAL HIGH (ref 4.0–10.5)

## 2018-07-28 NOTE — Progress Notes (Signed)
Subjective: Postpartum Day 1: Cesarean Delivery Patient reports incisional pain, tolerating PO and + flatus.    Objective: Vital signs in last 24 hours: Temp:  [98 F (36.7 C)-99 F (37.2 C)] 98.2 F (36.8 C) (09/02 0500) Pulse Rate:  [88-152] 99 (09/02 0500) Resp:  [16-22] 17 (09/02 0500) BP: (109-123)/(52-76) 114/52 (09/02 0500) SpO2:  [95 %-100 %] 95 % (09/02 0500) Vitals:   07/27/18 1600 07/27/18 2109 07/28/18 0109 07/28/18 0500  BP: 115/61 (!) 110/54 109/66 (!) 114/52  Pulse: 88 88 97 99  Resp: 16 18 18 17   Temp: 98 F (36.7 C) 98.5 F (36.9 C) 98.4 F (36.9 C) 98.2 F (36.8 C)  TempSrc: Oral Oral Oral Oral  SpO2:   96% 95%  Weight:      Height:        Physical Exam:  General: alert, cooperative and no distress Lochia: appropriate Uterine Fundus: firm Incision: healing well, no significant drainage DVT Evaluation: No evidence of DVT seen on physical exam.  Recent Labs    07/26/18 0104 07/28/18 0540  HGB 10.3* 7.9*  HCT 31.3* 23.5*    Assessment/Plan: Status post Cesarean section. Doing well postoperatively.  Continue current care.  Wynelle Bourgeois 07/28/2018, 6:46 AM

## 2018-07-29 ENCOUNTER — Encounter (HOSPITAL_COMMUNITY): Payer: Self-pay

## 2018-07-29 MED ORDER — OXYCODONE-ACETAMINOPHEN 5-325 MG PO TABS: 1.0000 | ORAL_TABLET | ORAL | 0 refills | Status: DC | PRN

## 2018-07-29 NOTE — Lactation Note (Signed)
This note was copied from a baby's chart. Lactation Consultation Note  Patient Name: Jennifer Acosta CBULA'G Date: 07/29/2018 Reason for consult: Follow-up assessment;Breast augmentation(REFERRRAL BY rn)Parents report  Maternal Data  Parents report baby breastfed about 1 hour ago or more.  Infant beside mom in bed, swaddled and clothed.  There are no documented poops on infants feeding chart however dad reports he feels sure infant has had a poop today.  Discussed pumping with mom/ decided to do hand expression since mom has had breast augmentation. Did not discuss with mom why she had augmentation.  Fob in room. No visible scars. Mom able to easily express breastmilk.  Mom able to demo back hand expression and dad able to demo offering small amounts of colostrum to infant via spoon.  Infant started showing hunger cues.  Observed mom latch infant.  Mom latches her in foot ball hold and does not use anything for support for baby or herself. Urged mom to use pillows to support her and baby and get comfortable.  Showed mom how baby's head had to tilt up slightly so infant could swallow.  Mom noted nipple mishapen past bf.  Mom took her off the breast because she wanted to see some different positions.  Showed mom cross cradle hold and laid back bf.  Nipple not mishapen in cross cradle.  Somewhat mishapen in laid back but think mom and infant got so comfotable she slid down some.  Enc mom to try and burp her and offer second breast.  Enc mom to feed back EBM past breastfeeding regardless of how good she fed at the breast. Enc mom and dad to feed back two-three teaspons of EBM past breastfeeds and more if she still seemed hungry.  Urged mom to take her mits off during feeds. Will follow up this evening with PM LC. Feeding Feeding Type: Breast Fed Length of feed: 10 min  LATCH Score Latch: Grasps breast easily, tongue down, lips flanged, rhythmical sucking.  Audible Swallowing: A few with  stimulation  Type of Nipple: Everted at rest and after stimulation  Comfort (Breast/Nipple): Filling, red/small blisters or bruises, mild/mod discomfort  Hold (Positioning): Assistance needed to correctly position infant at breast and maintain latch.  LATCH Score: 7  Interventions Interventions: Assisted with latch;Hand express;Adjust position;Support pillows;Expressed milk  Lactation Tools Discussed/Used Tools: Other (comment)(spoon and hand expressing)   Consult Status Consult Status: Follow-up Date: 07/29/18    Neomia Dear 07/29/2018, 5:22 PM

## 2018-07-29 NOTE — Lactation Note (Signed)
This note was copied from a baby's chart. Lactation Consultation Note  Patient Name: Jennifer Acosta VCBSW'H Date: 07/29/2018 Reason for consult: Follow-up assessment;Term;Infant weight loss P1, 40 hours old female infant LC entered room, mom holding baby in her lap infant is asleep. Per parents,infant had 4 wet and 6 soiled diapers in past 24 hours.  Parents report a and wet and soiled diaper prior to [facility] entering their room.  Per mom, she just finished BF infant 20 mins.,  prior to [facility] entering the room. Mom BF for  15 minutes. Mom feels BF is going well and she is confident in her BF abilities.  LC ask parents to call if they have any questions or concerns or need any assistance with BF.  Maternal Data    Feeding Feeding Type: Breast Fed Length of feed: 15 min  LATCH Score Latch: Grasps breast easily, tongue down, lips flanged, rhythmical sucking.  Audible Swallowing: Spontaneous and intermittent  Type of Nipple: Everted at rest and after stimulation  Comfort (Breast/Nipple): Soft / non-tender  Hold (Positioning): Assistance needed to correctly position infant at breast and maintain latch.  LATCH Score: 9  Interventions    Lactation Tools Discussed/Used     Consult Status Consult Status: Follow-up Date: 07/29/18 Follow-up type: In-patient    [name] 07/29/2018, 1:27 AM

## 2018-07-29 NOTE — Progress Notes (Signed)
POSTPARTUM PROGRESS NOTE  Post Operative Day 1  Subjective:  Lind Jervis is a 27 y.o. G1P1001 s/p pLTCS at [redacted]w[redacted]d due to fetal intolerance to labor.  No acute events overnight.  Pt denies problems with ambulating, voiding or po intake.  She denies nausea or vomiting.  Pain is well controlled.  She has had flatus. She has not had bowel movement.  Lochia Small.   Objective: Blood pressure 114/66, pulse (!) 56, temperature 98.2 F (36.8 C), temperature source Oral, resp. rate 17, height 5\' 2"  (1.575 m), weight 67.5 kg, last menstrual period 10/11/2017, SpO2 97 %, unknown if currently breastfeeding.  Physical Exam:  General: alert, cooperative and no distress Abdomen: soft, nontender,  Uterine Fundus: firm, appropriately tender DVT Evaluation: No calf swelling or tenderness Extremities: no edema Skin:  incision clean/dry/intact  Recent Labs    07/28/18 0540  HGB 7.9*  HCT 23.5*    Assessment/Plan: Rossanna Herdon is a 27 y.o. G1P1001 s/p pLTCS at [redacted]w[redacted]d   POD#2 - Doing well Contraception: Nexplanon Feeding: breast Dispo: Plan for discharge tomorrow, patient wanted to be discharged today but later found out baby was not being discharged due to jaundice and is going to stay until tomorrow.   LOS: 3 days   Mcneil Sober 07/29/2018, 2:59 PM

## 2018-07-30 MED ORDER — FERROUS SULFATE 325 (65 FE) MG PO TABS: 325.0000 mg | ORAL_TABLET | Freq: Two times a day (BID) | ORAL | 0 refills | Status: DC

## 2018-07-30 MED ORDER — OXYCODONE-ACETAMINOPHEN 5-325 MG PO TABS: 1.0000 | ORAL_TABLET | ORAL | 0 refills | Status: DC | PRN

## 2018-07-30 NOTE — Lactation Note (Signed)
This note was copied from a baby's chart. Lactation Consultation Note  Patient Name: Jennifer Acosta Date: 07/30/2018 Reason for consult: Follow-up assessment;Infant weight loss;Other (Comment);Primapara;1st time breastfeeding(5% weight loss/ post dates / 14.6 bili at 68 hours )  Per mom having some nipple soreness , LC offered to assess breast tissue, and mom receptive.  LC noted breast to be full, warm , and milk is both breast. Areola some edema, no breakdown of the  Nipples. LC instructed mom on the  Use comfort gels after feedings, when not using place in the refrig. And alternate with breast shells except when sleeping and hand pump, #24 F good fit and mom able to operate the hand pump well. Sore nipple and engorgement prevention and tx reviewed.  Baby recently breast fed at 1000 for 20 mins.  LC recommended since the baby is favoring one breast over the other, ( Right over left) , per dad baby latches well in the football. LC recommended trying the cross cradle on the other breast.  LC explained to mom and dad eventually baby won't care and now that the milk is in it will get easier.  LC discussed importance of STS feedings until the baby is back to birth weight and gaining steadily , also can stay awake for a feeding. Due to the baby being jaundice its important to feed the baby often 8-12 x's a day . Discussed nutritive vs non - nutritive feeding patterns, and to watch for hanging out latched.  LC mentioned the baby may have some feedings where the baby feeds 1st breast softens well and doesn't feed on the 2nd breast right away. Important to release down to comfort.  Mother informed of post-discharge support and given phone number to the lactation department, including services for phone call assistance; out-patient appointments; and breastfeeding support group. List of other breastfeeding resources in the community given in the handout. Encouraged mother to call for problems  or concerns related to breastfeeding.  Per dad baby recently fed at 1000 for 20 mins.      Maternal Data Has patient been taught Hand Expression?: Yes(LC reviewed , milk coming in )  Feeding Feeding Type: (pe rmom last fed at 1000 for 20 mins ) Length of feed: 20 min(per mom )  LATCH Score                   Interventions Interventions: Breast feeding basics reviewed;Shells;Hand pump  Lactation Tools Discussed/Used Tools: Shells;Pump;Comfort gels Shell Type: Inverted Breast pump type: Manual Pump Review: Setup, frequency, and cleaning;Milk Storage Initiated by:: MAI  Date initiated:: 07/30/18   Consult Status Consult Status: Complete Date: 07/30/18    Kathrin Greathouse 07/30/2018, 10:49 AM

## 2018-07-30 NOTE — Discharge Summary (Addendum)
OB Discharge Summary     Patient Name: Jennifer Acosta DOB: [birth year] MRN: [id number]  Date of admission: 07/26/2018 Delivering MD: Jennifer Acosta   Date of discharge: 07/30/2018  Admitting diagnosis: Induction  Intrauterine pregnancy: Unknown     Secondary diagnosis:  Active Problems:   Encounter for induction of labor   Post-dates pregnancy   Post term pregnancy over 40 weeks  Additional problems: non-reassuring fetal heart tracing     Discharge diagnosis: Term Pregnancy Delivered                                                                                                Post partum procedures:none  Augmentation: Pitocin  Complications: None  Hospital course:  Induction of Labor With Cesarean Section  27 y.o. yo G1P1001 at Unknown was admitted to the hospital 07/26/2018 for induction of labor. Patient had a labor course significant for NRFHT. The patient went for cesarean section due to Non-Reassuring FHR, and delivered a Viable infant,07/27/2018  Membrane Rupture Time/Date: 7:00 AM ,07/26/2018   Details of operation can be found in separate operative Note.  Patient had an uncomplicated postpartum course. She is ambulating, tolerating a regular diet, passing flatus, and urinating well.  Patient is discharged home in stable condition on 07/30/18.                                    Physical exam  Vitals:   07/29/18 1500 07/29/18 1541 07/29/18 2146 07/30/18 0608  BP: 119/64 119/64 122/69 106/71  Pulse: 62 62 (!) 54 (!) 55  Resp: 17  18 18   Temp: 98.2 F (36.8 C) 98.2 F (36.8 C) 97.9 F (36.6 C) 97.8 F (36.6 C)  TempSrc: Oral Oral Oral Oral  SpO2:   97%   Weight:      Height:       General: alert, cooperative and no distress Lochia: appropriate Uterine Fundus: firm Incision: Healing well with no significant drainage DVT Evaluation: No evidence of DVT seen on physical exam. Labs: Lab Results  Component Value Date   WBC 25.3 (H) 07/28/2018   HGB 7.9 (L)  07/28/2018   HCT 23.5 (L) 07/28/2018   MCV 95.5 07/28/2018   PLT 206 07/28/2018   No flowsheet data found.  Discharge instruction: per After Visit Summary and "Baby and Me Booklet".  After visit meds:  Allergies as of 07/30/2018   No Known Allergies     Medication List    TAKE these medications   ferrous sulfate 325 (65 FE) MG tablet Take 1 tablet (325 mg total) by mouth 2 (two) times daily with a meal.   oxyCODONE-acetaminophen 5-325 MG tablet Commonly known as:  PERCOCET/ROXICET Take 1 tablet by mouth every 4 (four) hours as needed (pain scale 4-7).   prenatal multivitamin Tabs tablet Take 1 tablet by mouth daily at 12 noon.       Diet: routine diet  Activity: Advance as tolerated. Pelvic rest for 6 weeks.   Outpatient follow up:1 week incision check Follow  up Appt:No future appointments. Follow up Visit:No follow-ups on file.  Postpartum contraception: Nexplanon  Newborn Data: Live born female  Birth Weight: 6 lb 7.2 oz (2925 g) APGAR: 8, 8  Newborn Delivery   Birth date/time:  07/27/2018 08:57:00 Delivery type:  C-Section, Low Transverse Trial of labor:  Yes C-section categorization:  Primary     Baby Feeding: Breast Disposition:home with mother   07/30/2018 Jennifer Mo, MD   OB FELLOW DISCHARGE ATTESTATION  I have seen and examined this patient and agree with above documentation in the resident's note.   Due to HgB drop of 10.3>7.9 post-operatively, will d/c with Fe supplement. Patient is asymptomatic and stable for d/c home.   Jennifer Acosta, D.O. OB Fellow  07/30/2018, 10:15 AM

## 2018-07-30 NOTE — Lactation Note (Signed)
This note was copied from a baby's chart. Lactation Consultation Note  Patient Name: Jennifer Acosta [id number] Date: 07/30/2018 Reason for consult: Follow-up assessment P1, 66 hour female infant. Mom reports infant fed prior  to [facility] entering room, infant BF for 20 minutes.  Mom states BF is going well since other LC helped her earlier. Per mom, she  has not been doing any hand expression as suggest by  LC. Does not need any help with breastfeeding  at this time.    Maternal Data    Feeding Feeding Type: Breast Fed Length of feed: 15 min  LATCH Score                   Interventions    Lactation Tools Discussed/Used     Consult Status      [name] 07/30/2018, 3:15 AM

## 2018-07-30 NOTE — Discharge Instructions (Signed)

## 2018-08-11 ENCOUNTER — Ambulatory Visit (INDEPENDENT_AMBULATORY_CARE_PROVIDER_SITE_OTHER): Payer: 59

## 2018-08-11 VITALS — BP 118/85 | HR 102 | Wt 128.6 lb

## 2018-08-11 DIAGNOSIS — Z5189 Encounter for other specified aftercare: Secondary | ICD-10-CM

## 2018-08-11 NOTE — Progress Notes (Signed)
Pt here today for incision check s/p primary c-section on 07/27/18 .  Incision has steri strips applied.  No bleeding, erythema, or swelling.  Pt denies any fever sx.  Pt reports PP visit scheduled for Oct 11th @ [facility].  Advised pt to continue monitor for infection and if she has any concerns to please give the office a call.  Pt stated understanding.

## 2018-08-12 NOTE — Progress Notes (Signed)
I have reviewed this chart and agree with the RN/CMA assessment and management.    Ved Martos C Taija Mathias, MD, FACOG Attending Physician, Faculty [facility]  

## 2018-10-15 IMAGING — US US MFM FETAL NUCHAL TRANSLUCENCY
1 series · 15 of 28 positions shown · non-contrast
Comparison: none

[Series 1: us mfm fetal nuchal translucency · 15 of 53 slices shown]
[im 1/53]
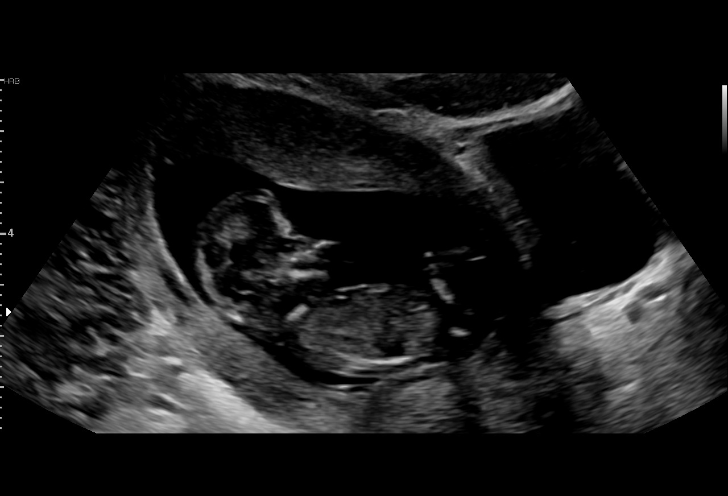
[im 4/53]
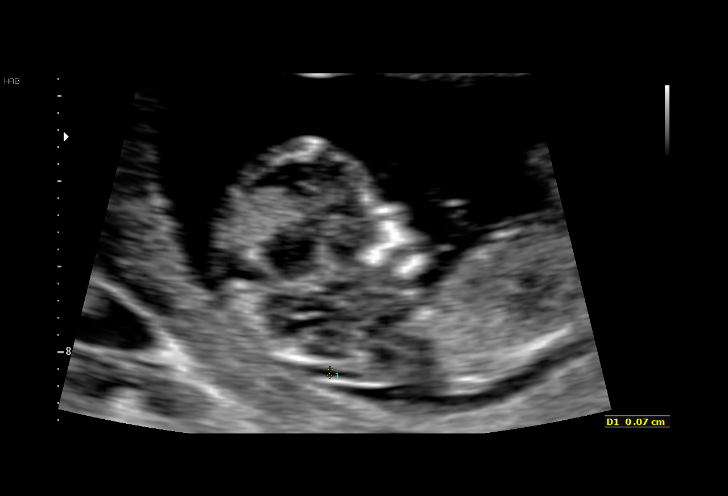
[im 8/53]
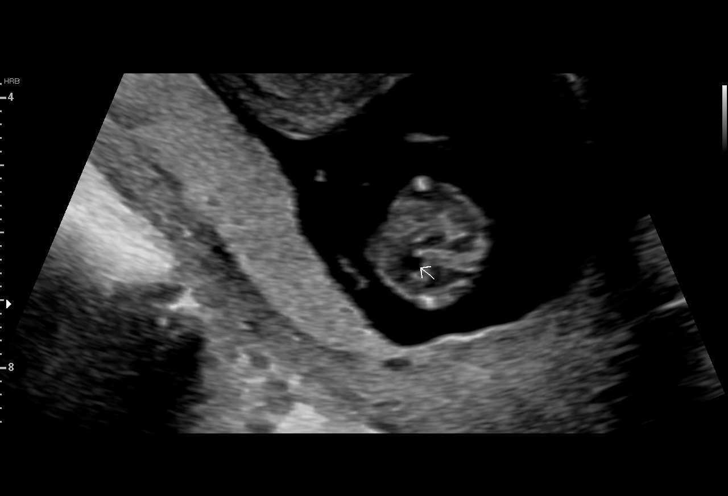
[im 12/53]
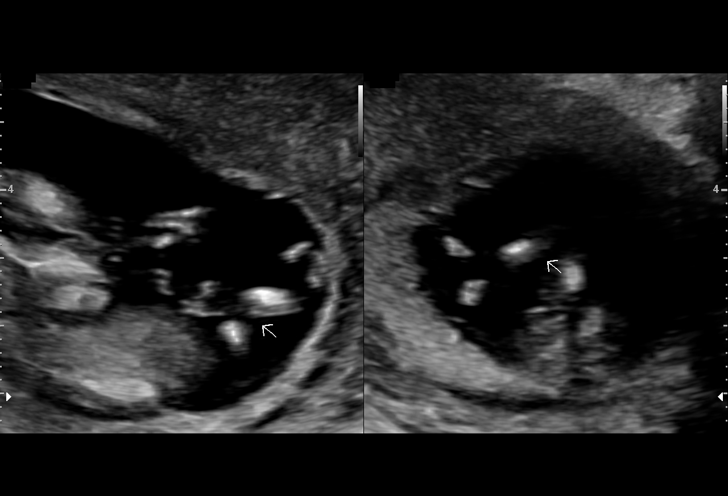
[im 16/53]
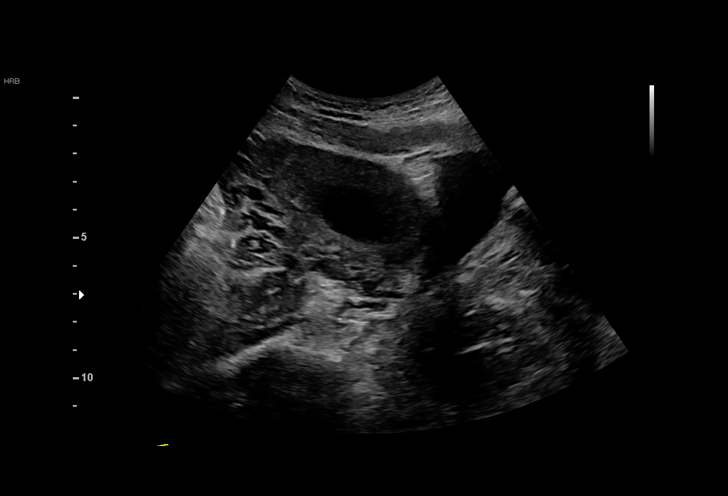
[im 20/53]
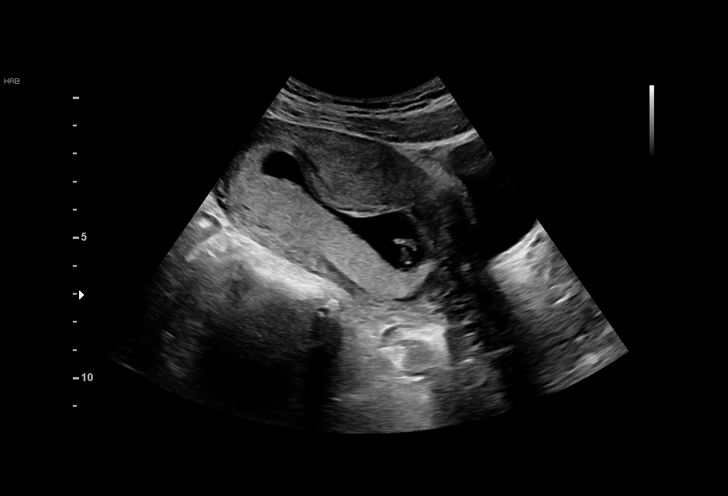
[im 24/53]
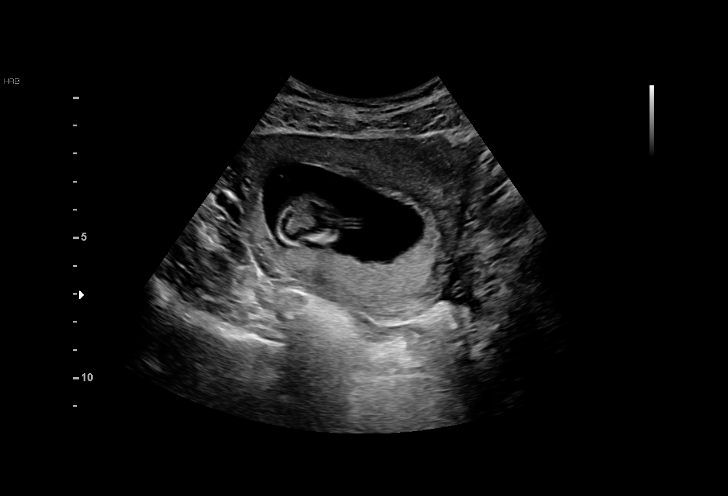
[im 27/53]
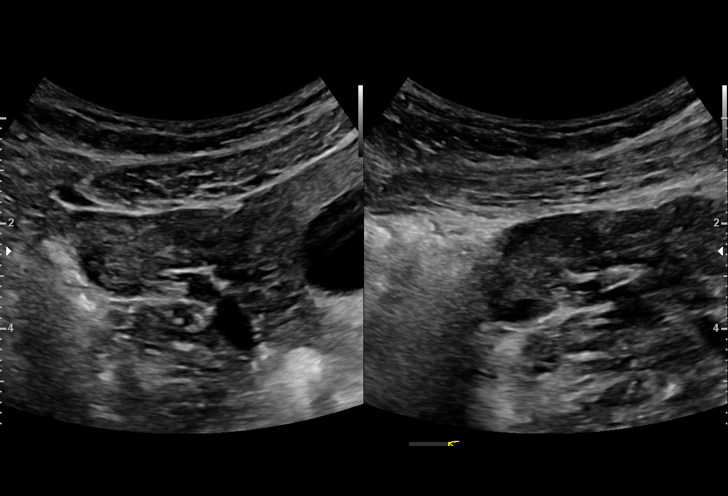
[im 29/53]
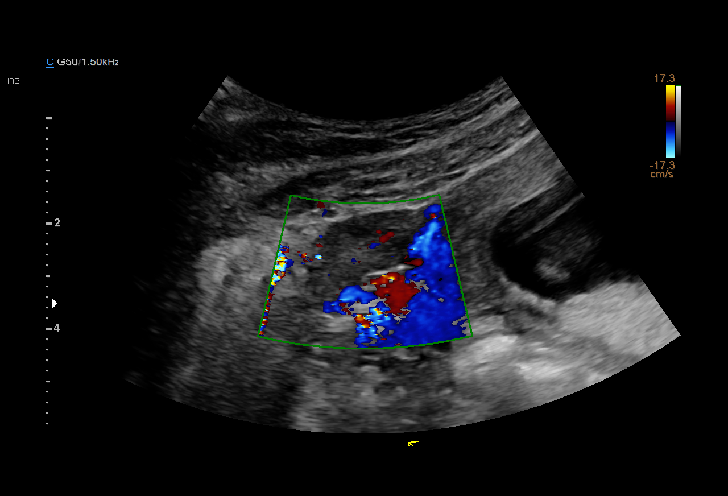
[im 33/53]
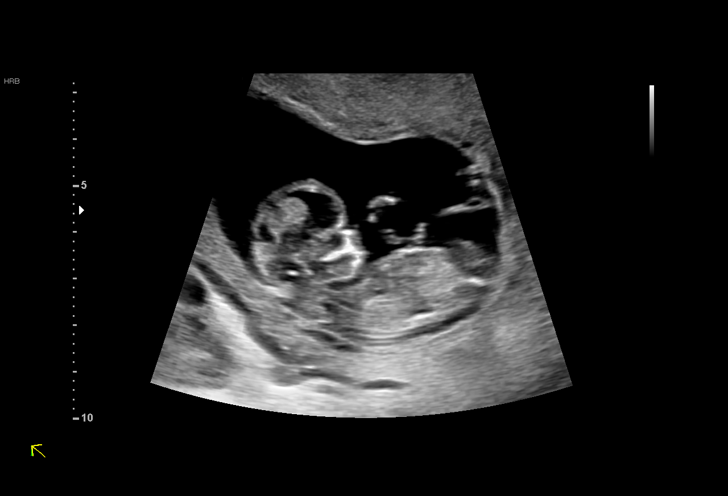
[im 37/53]
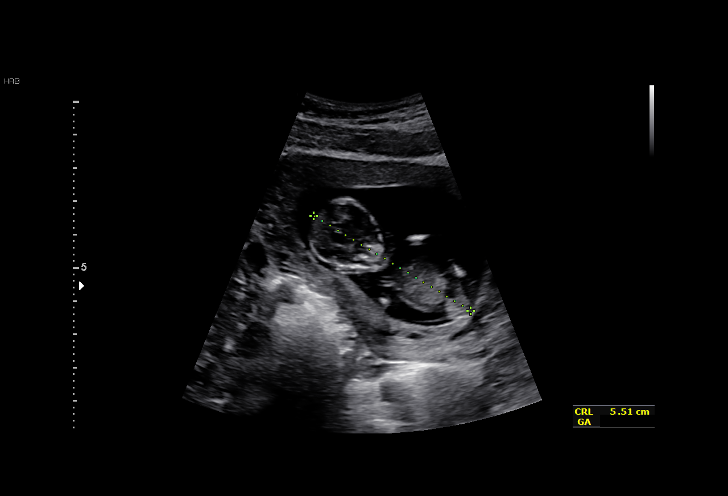
[im 41/53]
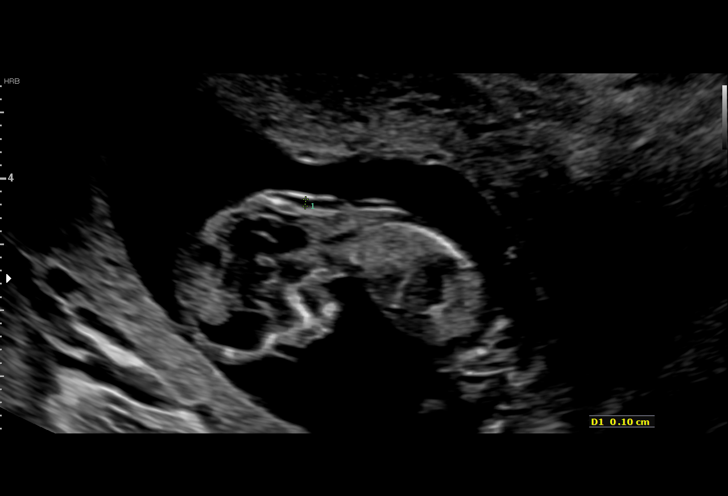
[im 45/53]
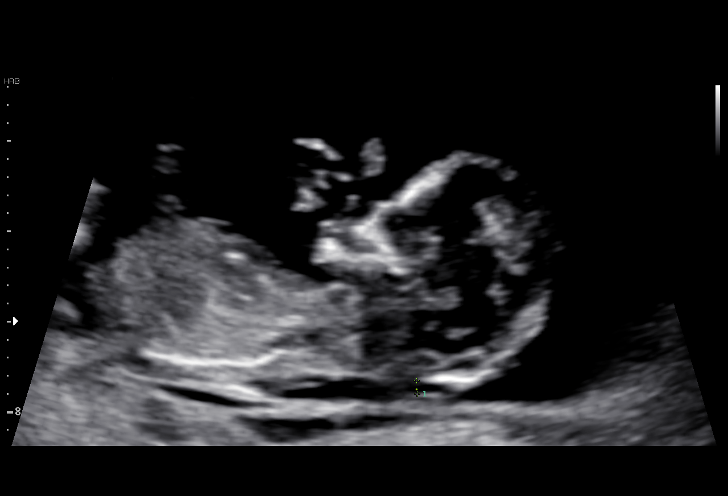
[im 49/53]
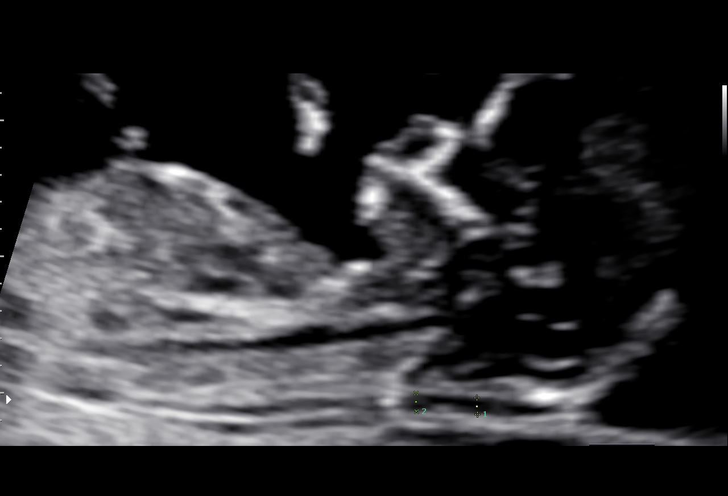
[im 53/53]
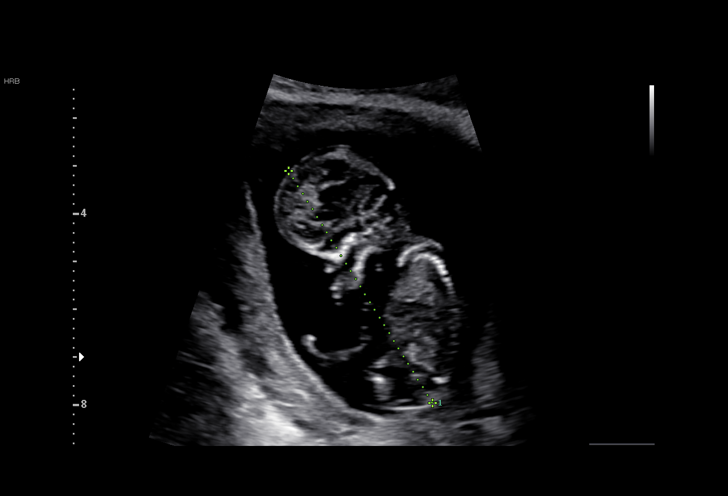

[15 of 28 positions shown; findings below may reference images not displayed]

TOOLIK

JEMIMA NP

TRANSLUCENCY

1  KHADHER ABOO            [id number]       [id number]     [id number]
Indications

13 weeks gestation of pregnancy
Encounter for nuchal translucency
OB History

Blood Type:            Height:  5'1"   Weight (lb):  122       BMI:
Gravidity:    1
Fetal Evaluation

Num Of Fetuses:     1
Preg. Location:     Intrauterine
Fetal Heart         148
Rate(bpm):
Cardiac Activity:   Observed

Amniotic Fluid
AFI FV:      Subjectively within normal limits
Gestational Age

LMP:           13w 0d        Date:  10/11/17                 EDD:   07/18/18
Best:          13w 0d     Det. By:  LMP  (10/11/17)          EDD:   07/18/18
1st Trimester Genetic Sonogram Screening

CRL:              57  mm    G. Age:   12w 1d                 EDD:   07/24/18
Nuc Trans:       1.3  mm

Nasal Bone:                 Present
Anatomy

Cranium:               Appears normal         Bladder:                Appears normal
Choroid Plexus:        Appears normal         Upper Extremities:      Visualized
Thoracic:              Appears normal         Lower Extremities:      Visualized
Stomach:               Appears normal, left
sided
Cervix Uterus Adnexa

Cervix
Normal appearance by transabdominal scan.

Uterus
No abnormality visualized.

Left Ovary
Size(cm)     2.39   x   1.3    x  1.36      Vol(ml):
Within normal limits.

Right Ovary
Size(cm)     2.55   x   1.19   x  1.71      Vol(ml):
Within normal limits.

Cul De Sac:   No free fluid seen.

Adnexa:       No abnormality visualized.
Impression

SIUP at 13+0 weeks
No gross abnormalities identified
NT measurement was within normal limits for this GA; NB
present
Normal amniotic fluid volume
Measurements consistent with LMP dating
Recommendations

Offer MSAFP in the second trimester for ONTD screening
Offer detailed U/S by 18 weeks

## 2021-03-23 ENCOUNTER — Other Ambulatory Visit: Payer: Self-pay

## 2021-03-23 ENCOUNTER — Ambulatory Visit
Admission: EM | Admit: 2021-03-23 | Discharge: 2021-03-23 | Disposition: A | Discharge status: Home / Self Care | Payer: BC Managed Care – PPO | Attending: Nurse Practitioner | Admitting: Nurse Practitioner

## 2021-03-23 DIAGNOSIS — J039 Acute tonsillitis, unspecified: Secondary | ICD-10-CM | POA: Insufficient documentation

## 2021-03-23 DIAGNOSIS — J029 Acute pharyngitis, unspecified: Secondary | ICD-10-CM

## 2021-03-23 DIAGNOSIS — H9202 Otalgia, left ear: Secondary | ICD-10-CM | POA: Insufficient documentation

## 2021-03-23 LAB — POCT RAPID STREP A (OFFICE): Rapid Strep A Screen: NEGATIVE

## 2021-03-23 MED ORDER — AMOXICILLIN 500 MG PO CAPS: 500.0000 mg | ORAL_CAPSULE | Freq: Two times a day (BID) | ORAL | 0 refills | Status: AC

## 2021-03-23 MED ORDER — METHYLPREDNISOLONE 4 MG PO TBPK: ORAL_TABLET | ORAL | 0 refills | Status: DC

## 2021-03-23 MED ORDER — FLUTICASONE PROPIONATE 50 MCG/ACT NA SUSP: 2.0000 | Freq: Every day | NASAL | 0 refills | Status: DC

## 2021-03-23 NOTE — ED Triage Notes (Signed)
Pt c/o sore throat with difficulty swallowing x1wk and swelling to lt side of neck.

## 2021-03-23 NOTE — ED Provider Notes (Signed)
[facility]    CSN: [id number] Arrival date & time: 03/23/21  1756      History   Chief Complaint Chief Complaint  Patient presents with  . Sore Throat    HPI Jennifer Acosta is a 30 y.o. female.   Subjective:  Jennifer Acosta is a 30 y.o. female who presents for evaluation of a sore throat. Associated symptoms include left ear pain. Onset of symptoms was 1 week ago and has been gradually worsening since that time.  It is very painful to swallow but she does not have any difficulty with swallowing.  She denies any fevers, sweats, chills, body aches, congestion, cough, nausea, vomiting or headache.  She is drinking plenty of fluids. She has not had recent close exposure to someone with proven streptococcal pharyngitis.  The following portions of the patient's history were reviewed and updated as appropriate: allergies, current medications, past family history, past medical history, past social history, past surgical history and problem list.       Past Medical History:  Diagnosis Date  . Medical history non-contributory   . UTI (urinary tract infection)     Patient Active Problem List   Diagnosis Date Noted  . Encounter for induction of labor 07/26/2018  . Post-dates pregnancy 07/26/2018  . Post term pregnancy over 40 weeks 07/26/2018    Past Surgical History:  Procedure Laterality Date  . BREAST SURGERY    . CESAREAN SECTION N/A 07/27/2018   Procedure: CESAREAN SECTION;  Surgeon: Adam Phenix, MD;  Location: [facility] BIRTHING SUITES;  Service: Obstetrics;  Laterality: N/A;    OB History    Gravida  1   Para  1   Term  1   Preterm      AB      Living  1     SAB      IAB      Ectopic      Multiple  0   Live Births  1            Home Medications    Prior to Admission medications   Medication Sig Start Date End Date Taking? Authorizing Provider  amoxicillin (AMOXIL) 500 MG capsule Take 1 capsule (500 mg total) by mouth  2 (two) times daily for 7 days. 03/23/21 03/30/21 Yes Lurline Idol, FNP  fluticasone (FLONASE) 50 MCG/ACT nasal spray Place 2 sprays into both nostrils daily. 03/23/21  Yes Lurline Idol, FNP  methylPREDNISolone (MEDROL DOSEPAK) 4 MG TBPK tablet Take as directed 03/23/21  Yes Lurline Idol, FNP    Family History Family History  Problem Relation Age of Onset  . Hypertension Mother     Social History Social History   Tobacco Use  . Smoking status: Never Smoker  . Smokeless tobacco: Never Used  Vaping Use  . Vaping Use: Never used  Substance Use Topics  . Alcohol use: Not Currently  . Drug use: No     Allergies   Patient has no known allergies.   Review of Systems Review of Systems  Constitutional: Negative for fever.  HENT: Positive for ear pain and sore throat. Negative for congestion, rhinorrhea, trouble swallowing and voice change.   Respiratory: Negative for cough.   Gastrointestinal: Negative for nausea and vomiting.  Neurological: Negative for headaches.  All other systems reviewed and are negative.    Physical Exam Triage Vital Signs ED Triage Vitals  Enc Vitals Group     BP 03/23/21 1936 (!) 175/91  Pulse Rate 03/23/21 1936 85     Resp 03/23/21 1936 20     Temp 03/23/21 1936 98 F (36.7 C)     Temp Source 03/23/21 1936 Oral     SpO2 03/23/21 1936 95 %     Weight --      Height --      Head Circumference --      Peak Flow --      Pain Score 03/23/21 1938 6     Pain Loc --      Pain Edu? --      Excl. in GC? --    No data found.  Updated Vital Signs BP (!) 175/91 (BP Location: Left Arm)   Pulse 85   Temp 98 F (36.7 C) (Oral)   Resp 20   LMP 03/09/2021   SpO2 95%   Breastfeeding No   Visual Acuity Right Eye Distance:   Left Eye Distance:   Bilateral Distance:    Right Eye Near:   Left Eye Near:    Bilateral Near:     Physical Exam Vitals reviewed.  Constitutional:      General: She is not in acute distress.     Appearance: She is well-developed. She is not ill-appearing or toxic-appearing.  HENT:     Head: Normocephalic.     Right Ear: Tympanic membrane and ear canal normal.     Left Ear: Tympanic membrane and ear canal normal.     Nose: No congestion or rhinorrhea.     Mouth/Throat:     Mouth: Mucous membranes are moist. No oral lesions.     Pharynx: Pharyngeal swelling and posterior oropharyngeal erythema present. No oropharyngeal exudate or uvula swelling.     Tonsils: No tonsillar exudate.  Eyes:     Conjunctiva/sclera: Conjunctivae normal.  Cardiovascular:     Rate and Rhythm: Normal rate.  Pulmonary:     Effort: Pulmonary effort is normal.  Musculoskeletal:     Cervical back: Normal range of motion and neck supple.  Lymphadenopathy:     Cervical: Cervical adenopathy present.  Skin:    General: Skin is warm and dry.  Neurological:     General: No focal deficit present.     Mental Status: She is alert.      UC Treatments / Results  Labs (all labs ordered are listed, but only abnormal results are displayed) Labs Reviewed  CULTURE, GROUP A STREP [facility])  POCT RAPID STREP A (OFFICE)    EKG   Radiology No results found.  Procedures Procedures (including critical care time)  Medications Ordered in UC Medications - No data to display  Initial Impression / Assessment and Plan / UC Course  I have reviewed the triage vital signs and the nursing notes.  Pertinent labs & imaging results that were available during my care of the patient were reviewed by me and considered in my medical decision making (see chart for details).    30 year old female presenting with a 1 week history of sore throat and left ear pain.  No fevers, sweats, chills, body aches, congestion, cough, nausea, vomiting or headache.  Denies any recent exposure to anyone with strep pharyngitis.  Rapid strep here negative.  She is afebrile.  Vital signs stable.  Nontoxic-appearing.  Oropharynx erythema and edema.   The remainder of her physical exam unremarkable. Patient placed on antibiotics, steroids and nasal spray.  Use of OTC decongestant recommended.  Supportive care measures discussed.  Throat cultures pending. Follow up  as needed.  Today's evaluation has revealed no signs of a dangerous process. Discussed diagnosis with patient and/or guardian. Patient and/or guardian aware of their diagnosis, possible red flag symptoms to watch out for and need for close follow up. Patient and/or guardian understands verbal and written discharge instructions. Patient and/or guardian comfortable with plan and disposition.  Patient and/or guardian has a clear mental status at this time, good insight into illness (after discussion and teaching) and has clear judgment to make decisions regarding their care  This care was provided during an unprecedented National Emergency due to the Novel Coronavirus (COVID-19) pandemic. COVID-19 infections and transmission risks place heavy strains on healthcare resources.  As this pandemic evolves, our facility, providers, and staff strive to respond fluidly, to remain operational, and to provide care relative to available resources and information. Outcomes are unpredictable and treatments are without well-defined guidelines. Further, the impact of COVID-19 on all aspects of urgent care, including the impact to patients seeking care for reasons other than COVID-19, is unavoidable during this national emergency. At this time of the global pandemic, management of patients has significantly changed, even for non-COVID positive patients given high local and regional COVID volumes at this time requiring high healthcare system and resource utilization. The standard of care for management of both COVID suspected and non-COVID suspected patients continues to change rapidly at the local, regional, national, and global levels. This patient was worked up and treated to the best available but ever changing  evidence and resources available at this current time.   Documentation was completed with the aid of voice recognition software. Transcription may contain typographical errors.  Final Clinical Impressions(s) / UC Diagnoses   Final diagnoses:  Sore throat  Acute tonsillitis, unspecified etiology  Otalgia of left ear     Discharge Instructions     . Tonsillitis is an infection of the throat. This infection causes the tonsils to become red, tender, and swollen.  . You will be treated with steroids, antibiotics and a nasal spray . You may also do warm salt water gargles and over-the-counter lozenges to help with your throat pain . Change her toothbrush on Sunday . You can take Tylenol and/or ibuprofen as needed for pain    ED Prescriptions    Medication Sig Dispense Auth. Provider   amoxicillin (AMOXIL) 500 MG capsule Take 1 capsule (500 mg total) by mouth 2 (two) times daily for 7 days. 14 capsule Deshae Dickison, [place], FNP   methylPREDNISolone (MEDROL DOSEPAK) 4 MG TBPK tablet Take as directed 21 tablet Silva Aamodt, FNP   fluticasone (FLONASE) 50 MCG/ACT nasal spray Place 2 sprays into both nostrils daily. 9.9 mL Lurline Idol, FNP     PDMP not reviewed this encounter.   Lurline Idol, [place] 03/23/21 2004

## 2021-03-23 NOTE — Discharge Instructions (Addendum)
Tonsillitis is an infection of the throat. This infection causes the tonsils to become red, tender, and swollen.  You will be treated with steroids, antibiotics and a nasal spray You may also do warm salt water gargles and over-the-counter lozenges to help with your throat pain Change her toothbrush on Sunday You can take Tylenol and/or ibuprofen as needed for pain

## 2021-03-25 LAB — CULTURE, GROUP A STREP (THRC)

## 2021-03-27 LAB — CULTURE, GROUP A STREP (THRC)

## 2021-09-18 LAB — OB RESULTS CONSOLE HEPATITIS B SURFACE ANTIGEN: Hepatitis B Surface Ag: NEGATIVE

## 2021-09-18 LAB — OB RESULTS CONSOLE ABO/RH

## 2021-09-18 LAB — OB RESULTS CONSOLE GC/CHLAMYDIA
Chlamydia: NEGATIVE
Gonorrhea: NEGATIVE

## 2021-09-18 LAB — OB RESULTS CONSOLE ANTIBODY SCREEN: Antibody Screen: NEGATIVE

## 2021-09-18 LAB — HEPATITIS C ANTIBODY: HCV Ab: NEGATIVE

## 2021-09-18 LAB — OB RESULTS CONSOLE HIV ANTIBODY (ROUTINE TESTING): HIV: NONREACTIVE

## 2021-09-18 LAB — OB RESULTS CONSOLE RUBELLA ANTIBODY, IGM: Rubella: IMMUNE

## 2021-09-18 LAB — OB RESULTS CONSOLE RPR: RPR: NONREACTIVE

## 2021-11-26 NOTE — L&D Delivery Note (Signed)
Neonatology Note:  ? ?Attendance at C-section: ? ?  I was asked by Dr. Bass to attend this C/S at term for FTP with thick meconium. The mother is a 30yo G2P1 with good prenatal care uncomplicated. ROM 16h 18m prior to delivery, fluid meconium. Infant vigorous with good spontaneous cry and tone. +60 sec DCC done. Needed bulb suctioning.   Lungs clearing to ausc, good tone and pink in DR.  Apgars 8 at 1 minute, 9 at 5 minutes.  Family updated.  To MBU in care of Pediatrician.   ? ?Jennifer Acosta C. Jennifer Biederman, MD ?

## 2022-02-21 LAB — OB RESULTS CONSOLE GBS: GBS: NEGATIVE

## 2022-03-25 ENCOUNTER — Other Ambulatory Visit: Payer: Self-pay | Admitting: Advanced Practice Midwife

## 2022-03-26 ENCOUNTER — Inpatient Hospital Stay (HOSPITAL_COMMUNITY)
Admission: AD | Admit: 2022-03-26 | Discharge: 2022-03-29 | DRG: 786 | Disposition: A | Discharge status: Home / Self Care | Payer: BC Managed Care – PPO | Attending: Obstetrics and Gynecology | Admitting: Obstetrics and Gynecology

## 2022-03-26 ENCOUNTER — Encounter (HOSPITAL_COMMUNITY): Payer: Self-pay | Admitting: *Deleted

## 2022-03-26 ENCOUNTER — Other Ambulatory Visit: Payer: Self-pay

## 2022-03-26 ENCOUNTER — Encounter (HOSPITAL_COMMUNITY): Payer: Self-pay | Admitting: Obstetrics and Gynecology

## 2022-03-26 ENCOUNTER — Telehealth (HOSPITAL_COMMUNITY): Payer: Self-pay | Admitting: *Deleted

## 2022-03-26 DIAGNOSIS — O480 Post-term pregnancy: Secondary | ICD-10-CM | POA: Diagnosis present

## 2022-03-26 DIAGNOSIS — O411230 Chorioamnionitis, third trimester, not applicable or unspecified: Secondary | ICD-10-CM | POA: Diagnosis present

## 2022-03-26 DIAGNOSIS — N179 Acute kidney failure, unspecified: Secondary | ICD-10-CM | POA: Diagnosis not present

## 2022-03-26 DIAGNOSIS — O34211 Maternal care for low transverse scar from previous cesarean delivery: Secondary | ICD-10-CM | POA: Diagnosis present

## 2022-03-26 DIAGNOSIS — Z3A40 40 weeks gestation of pregnancy: Secondary | ICD-10-CM | POA: Diagnosis not present

## 2022-03-26 DIAGNOSIS — D62 Acute posthemorrhagic anemia: Secondary | ICD-10-CM | POA: Diagnosis not present

## 2022-03-26 DIAGNOSIS — O26833 Pregnancy related renal disease, third trimester: Secondary | ICD-10-CM | POA: Diagnosis present

## 2022-03-26 DIAGNOSIS — O9081 Anemia of the puerperium: Secondary | ICD-10-CM | POA: Diagnosis not present

## 2022-03-26 DIAGNOSIS — Z98891 History of uterine scar from previous surgery: Principal | ICD-10-CM

## 2022-03-26 DIAGNOSIS — O411290 Chorioamnionitis, unspecified trimester, not applicable or unspecified: Secondary | ICD-10-CM | POA: Diagnosis not present

## 2022-03-26 LAB — CBC
HCT: 35.2 % — ABNORMAL LOW (ref 36.0–46.0)
Hemoglobin: 11.8 g/dL — ABNORMAL LOW (ref 12.0–15.0)
MCH: 32.2 pg (ref 26.0–34.0)
MCHC: 33.5 g/dL (ref 30.0–36.0)
MCV: 96.2 fL (ref 80.0–100.0)
Platelets: 263 10*3/uL (ref 150–400)
RBC: 3.66 MIL/uL — ABNORMAL LOW (ref 3.87–5.11)
RDW: 14.3 % (ref 11.5–15.5)
WBC: 11 10*3/uL — ABNORMAL HIGH (ref 4.0–10.5)
nRBC: 0 % (ref 0.0–0.2)

## 2022-03-26 LAB — TYPE AND SCREEN
ABO/RH(D): O POS
Antibody Screen: NEGATIVE

## 2022-03-26 MED ORDER — FENTANYL CITRATE (PF) 100 MCG/2ML IJ SOLN
100.0000 ug | INTRAMUSCULAR | Status: DC | PRN
  Administered 2022-03-26 – 2022-03-27 (×2): 100 ug via INTRAVENOUS
  Filled 2022-03-26 (×2): qty 2

## 2022-03-26 MED ORDER — OXYTOCIN BOLUS FROM INFUSION: 333.0000 mL | Freq: Once | INTRAVENOUS | Status: DC

## 2022-03-26 MED ORDER — SOD CITRATE-CITRIC ACID 500-334 MG/5ML PO SOLN
30.0000 mL | ORAL | Status: DC | PRN
  Filled 2022-03-26: qty 30

## 2022-03-26 MED ORDER — OXYTOCIN-SODIUM CHLORIDE 30-0.9 UT/500ML-% IV SOLN
2.5000 [IU]/h | INTRAVENOUS | Status: DC
  Administered 2022-03-27: 30 [IU] via INTRAVENOUS

## 2022-03-26 MED ORDER — LACTATED RINGERS IV SOLN
INTRAVENOUS | Status: DC
  Administered 2022-03-26: 900 mL via INTRAVENOUS
  Administered 2022-03-26: 125 mL/h via INTRAVENOUS

## 2022-03-26 MED ORDER — ONDANSETRON HCL 4 MG/2ML IJ SOLN
4.0000 mg | Freq: Four times a day (QID) | INTRAMUSCULAR | Status: DC | PRN
Start: 2022-03-26 — End: 2022-03-27
  Administered 2022-03-27: 4 mg via INTRAVENOUS

## 2022-03-26 MED ORDER — OXYCODONE-ACETAMINOPHEN 5-325 MG PO TABS: 2.0000 | ORAL_TABLET | ORAL | Status: DC | PRN

## 2022-03-26 MED ORDER — OXYCODONE-ACETAMINOPHEN 5-325 MG PO TABS: 1.0000 | ORAL_TABLET | ORAL | Status: DC | PRN

## 2022-03-26 MED ORDER — LIDOCAINE HCL (PF) 1 % IJ SOLN: 30.0000 mL | INTRAMUSCULAR | Status: DC | PRN

## 2022-03-26 MED ORDER — LACTATED RINGERS IV SOLN
500.0000 mL | INTRAVENOUS | Status: DC | PRN
  Administered 2022-03-27: 500 mL via INTRAVENOUS

## 2022-03-26 MED ORDER — ACETAMINOPHEN 325 MG PO TABS: 650.0000 mg | ORAL_TABLET | ORAL | Status: DC | PRN

## 2022-03-26 NOTE — H&P (Signed)
OBSTETRIC ADMISSION HISTORY AND PHYSICAL ? ?Jennifer Acosta is a 31 y.o. female G2P1001 with IUP at [redacted]w[redacted]d by LMP presenting for IOL/TOLAC due to [facility] 6/8 with low AFI (2cm) in the office today. She reports +FMs, No LOF, no VB, no blurry vision, headaches or peripheral edema, and RUQ pain.  She plans on breast feeding. She request POPs for birth control. ?She received her prenatal care at [facility]  ? ?Dating: By LMP --->  Estimated Date of Delivery: 03/21/22 ? ? ?Prenatal History/Complications:  ?--Previous C/S at 10 cm due NRFHT, desires TOLAC  ?--Iron deficiency anemia on po iron, hgb 11.8 on admit  ? ?Past Medical History: ?Past Medical History:  ?Diagnosis Date  ? Medical history non-contributory   ? UTI (urinary tract infection)   ? ? ?Past Surgical History: ?Past Surgical History:  ?Procedure Laterality Date  ? BREAST SURGERY    ? CESAREAN SECTION N/A 07/27/2018  ? Procedure: CESAREAN SECTION;  Surgeon: Adam Phenix, MD;  Location: [facility] BIRTHING SUITES;  Service: Obstetrics;  Laterality: N/A;  ? ? ?Obstetrical History: ?OB History   ? ? Gravida  ?2  ? Para  ?1  ? Term  ?1  ? Preterm  ?   ? AB  ?   ? Living  ?1  ?  ? ? SAB  ?   ? IAB  ?   ? Ectopic  ?   ? Multiple  ?0  ? Live Births  ?1  ?   ?  ?  ? ? ?Social History ?Social History  ? ?Socioeconomic History  ? Marital status: Single  ?  Spouse name: Not on file  ? Number of children: Not on file  ? Years of education: Not on file  ? Highest education level: Not on file  ?Occupational History  ? Not on file  ?Tobacco Use  ? Smoking status: Never  ? Smokeless tobacco: Never  ?Vaping Use  ? Vaping Use: Never used  ?Substance and Sexual Activity  ? Alcohol use: Not Currently  ? Drug use: No  ? Sexual activity: Yes  ?  Birth control/protection: None  ?Other Topics Concern  ? Not on file  ?Social History Narrative  ? Not on file  ? ?Social Determinants of Health  ? ?Financial Resource Strain: Not on file  ?Food Insecurity: Not on file  ?Transportation Needs: Not on  file  ?Physical Activity: Not on file  ?Stress: Not on file  ?Social Connections: Not on file  ? ? ?Family History: ?Family History  ?Problem Relation Age of Onset  ? Hypertension Mother   ? ? ?Allergies: ?No Known Allergies ? ?Medications Prior to Admission  ?Medication Sig Dispense Refill Last Dose  ? fluticasone (FLONASE) 50 MCG/ACT nasal spray Place 2 sprays into both nostrils daily. 9.9 mL 0   ? methylPREDNISolone (MEDROL DOSEPAK) 4 MG TBPK tablet Take as directed 21 tablet 0   ? ? ? ?Review of Systems  ? ?All systems reviewed and negative except as stated in HPI ? ?Blood pressure 139/86, pulse 87, temperature 98.3 ?F (36.8 ?C), temperature source Oral, resp. rate 16, height 5\' 1"  (1.549 m), weight 74.9 kg. ?General appearance: alert, cooperative, and no distress ?Lungs: Normal WOB  ?Heart: regular rate ?Abdomen: soft, non-tender ?Pelvic: NEFG ?Extremities: Homans sign is negative, no sign of DVT ?Presentation: cephalic ?Fetal monitoringBaseline: 135 bpm, Variability: Good {> 6 bpm), Accelerations: Reactive, and Decelerations: Absent ?Uterine activity every 5 minutes  ?Dilation: 1.5 ?Effacement (%): 50 ?Station: -3 ?Exam  by:: MD Annia Friendly ? ? ?Prenatal labs: ?ABO, Rh: --/--/O POS (05/01 1410) ?Antibody: NEG (05/01 1410) ?Rubella: Immune (10/24 0000) ?RPR: Nonreactive (10/24 0000)  ?HBsAg: Negative (10/24 0000)  ?HIV: Non-reactive (10/24 0000)  ?GBS: Negative/-- (03/29 0000)  ?1 hr Glucola passed ?Genetic screening  LR ?Anatomy US normal  ? ?Prenatal Transfer Tool  ?Maternal Diabetes: No ?Genetic Screening: Normal ?Maternal Ultrasounds/Referrals: Normal ?Fetal Ultrasounds or other Referrals:  None ?Maternal Substance Abuse:  No ?Significant Maternal Medications:  None ?Significant Maternal Lab Results: Group B Strep negative ? ?Results for orders placed or performed during the hospital encounter of 03/26/22 (from the past 24 hour(s))  ?CBC  ? Collection Time: 03/26/22  2:10 PM  ?Result Value Ref Range  ? WBC 11.0  (H) 4.0 - 10.5 K/uL  ? RBC 3.66 (L) 3.87 - 5.11 MIL/uL  ? Hemoglobin 11.8 (L) 12.0 - 15.0 g/dL  ? HCT 35.2 (L) 36.0 - 46.0 %  ? MCV 96.2 80.0 - 100.0 fL  ? MCH 32.2 26.0 - 34.0 pg  ? MCHC 33.5 30.0 - 36.0 g/dL  ? RDW 14.3 11.5 - 15.5 %  ? Platelets 263 150 - 400 K/uL  ? nRBC 0.0 0.0 - 0.2 %  ?Type and screen  ? Collection Time: 03/26/22  2:10 PM  ?Result Value Ref Range  ? ABO/RH(D) O POS   ? Antibody Screen NEG   ? Sample Expiration    ?  03/29/2022,2359 ?Performed at [facility], [address], [place], [place] [address] ?  ? ? ?Patient Active Problem List  ? Diagnosis Date Noted  ? Encounter for induction of labor 07/26/2018  ? Post-dates pregnancy 07/26/2018  ? Post term pregnancy over 40 weeks 07/26/2018  ? ? ?Assessment/Plan:  ?Jennifer Acosta is a 31 y.o. G2P1001 at [redacted]w[redacted]d here for post-dates IOL in the setting of BPP 6/8 today with low AFI 2 cm.  ? ?#Labor/TOLAC: Contracting on her own about every 5 minutes but describes as a mild cramping. Cervix quite posterior, placed cooks FB via speculum and confirmed placement with digital exam afterwards by Dr. Shela Commons PGY-1. Plan to add low dose pit if still in place over the next several hours. Aware of risks associated with TOLAC and consent already signed.  ?#Pain: PRN  ?#FWB: Cat I  ?#ID: GBS negative  ?#MOF: breast and formula  ?#MOC: Unsure, considering POPs ?#Circ: Undecided, leaning towards no  ? ?Allayne Stack, DO  ?03/26/2022, 3:40 PM ? ?  ?

## 2022-03-26 NOTE — Telephone Encounter (Signed)
Preadmission screen  

## 2022-03-27 ENCOUNTER — Encounter (HOSPITAL_COMMUNITY)
Admission: AD | Disposition: A | Discharge status: Home / Self Care | Payer: Self-pay | Source: Home / Self Care | Attending: Obstetrics and Gynecology

## 2022-03-27 ENCOUNTER — Inpatient Hospital Stay (HOSPITAL_COMMUNITY): Payer: BC Managed Care – PPO | Admitting: Anesthesiology

## 2022-03-27 ENCOUNTER — Encounter (HOSPITAL_COMMUNITY): Payer: Self-pay | Admitting: Family Medicine

## 2022-03-27 DIAGNOSIS — O34211 Maternal care for low transverse scar from previous cesarean delivery: Secondary | ICD-10-CM

## 2022-03-27 DIAGNOSIS — Z3A40 40 weeks gestation of pregnancy: Secondary | ICD-10-CM

## 2022-03-27 DIAGNOSIS — O411230 Chorioamnionitis, third trimester, not applicable or unspecified: Secondary | ICD-10-CM

## 2022-03-27 DIAGNOSIS — Z98891 History of uterine scar from previous surgery: Principal | ICD-10-CM

## 2022-03-27 DIAGNOSIS — O480 Post-term pregnancy: Secondary | ICD-10-CM

## 2022-03-27 DIAGNOSIS — O411290 Chorioamnionitis, unspecified trimester, not applicable or unspecified: Secondary | ICD-10-CM | POA: Diagnosis not present

## 2022-03-27 LAB — CBC
HCT: 35.5 % — ABNORMAL LOW (ref 36.0–46.0)
Hemoglobin: 11.6 g/dL — ABNORMAL LOW (ref 12.0–15.0)
MCH: 31.9 pg (ref 26.0–34.0)
MCHC: 32.7 g/dL (ref 30.0–36.0)
MCV: 97.5 fL (ref 80.0–100.0)
Platelets: 264 10*3/uL (ref 150–400)
RBC: 3.64 MIL/uL — ABNORMAL LOW (ref 3.87–5.11)
RDW: 14.6 % (ref 11.5–15.5)
WBC: 22 10*3/uL — ABNORMAL HIGH (ref 4.0–10.5)
nRBC: 0 % (ref 0.0–0.2)

## 2022-03-27 LAB — COMPREHENSIVE METABOLIC PANEL
ALT: 12 U/L (ref 0–44)
AST: 22 U/L (ref 15–41)
Albumin: 2.7 g/dL — ABNORMAL LOW (ref 3.5–5.0)
Alkaline Phosphatase: 147 U/L — ABNORMAL HIGH (ref 38–126)
Anion gap: 10 (ref 5–15)
BUN: 13 mg/dL (ref 6–20)
CO2: 22 mmol/L (ref 22–32)
Calcium: 8.3 mg/dL — ABNORMAL LOW (ref 8.9–10.3)
Chloride: 108 mmol/L (ref 98–111)
Creatinine, Ser: 1.44 mg/dL — ABNORMAL HIGH (ref 0.44–1.00)
GFR, Estimated: 50 mL/min — ABNORMAL LOW (ref >60)
Glucose, Bld: 118 mg/dL — ABNORMAL HIGH (ref 70–99)
Potassium: 3.9 mmol/L (ref 3.5–5.1)
Sodium: 140 mmol/L (ref 135–145)
Total Bilirubin: 0.2 mg/dL — ABNORMAL LOW (ref 0.3–1.2)
Total Protein: 6.6 g/dL (ref 6.5–8.1)

## 2022-03-27 LAB — RPR: RPR Ser Ql: NONREACTIVE

## 2022-03-27 SURGERY — Surgical Case
Anesthesia: Epidural | Wound class: Clean Contaminated

## 2022-03-27 MED ORDER — ONDANSETRON HCL 4 MG/2ML IJ SOLN: 4.0000 mg | Freq: Three times a day (TID) | INTRAMUSCULAR | Status: DC | PRN

## 2022-03-27 MED ORDER — LACTATED RINGERS AMNIOINFUSION
INTRAVENOUS | Status: DC
Start: 2022-03-27 — End: 2022-03-27

## 2022-03-27 MED ORDER — SCOPOLAMINE 1 MG/3DAYS TD PT72
MEDICATED_PATCH | TRANSDERMAL | Status: AC
  Filled 2022-03-27: qty 1

## 2022-03-27 MED ORDER — KETOROLAC TROMETHAMINE 30 MG/ML IJ SOLN
INTRAMUSCULAR | Status: AC
  Filled 2022-03-27: qty 1

## 2022-03-27 MED ORDER — PRENATAL MULTIVITAMIN CH
1.0000 | ORAL_TABLET | Freq: Every day | ORAL | Status: DC
  Administered 2022-03-28 – 2022-03-29 (×2): 1 via ORAL
  Filled 2022-03-27 (×2): qty 1

## 2022-03-27 MED ORDER — IBUPROFEN 600 MG PO TABS: 600.0000 mg | ORAL_TABLET | Freq: Four times a day (QID) | ORAL | Status: DC

## 2022-03-27 MED ORDER — SENNOSIDES-DOCUSATE SODIUM 8.6-50 MG PO TABS
2.0000 | ORAL_TABLET | Freq: Every day | ORAL | Status: DC
  Administered 2022-03-28: 2 via ORAL
  Filled 2022-03-27 (×2): qty 2

## 2022-03-27 MED ORDER — FENTANYL-BUPIVACAINE-NACL 0.5-0.125-0.9 MG/250ML-% EP SOLN
EPIDURAL | Status: AC
  Filled 2022-03-27: qty 250

## 2022-03-27 MED ORDER — ACETAMINOPHEN 500 MG PO TABS
1000.0000 mg | ORAL_TABLET | Freq: Four times a day (QID) | ORAL | Status: DC
  Administered 2022-03-28 (×2): 1000 mg via ORAL
  Filled 2022-03-27 (×2): qty 2

## 2022-03-27 MED ORDER — FENTANYL CITRATE (PF) 100 MCG/2ML IJ SOLN: 25.0000 ug | INTRAMUSCULAR | Status: DC | PRN

## 2022-03-27 MED ORDER — GENTAMICIN SULFATE 40 MG/ML IJ SOLN
5.0000 mg/kg | INTRAVENOUS | Status: DC
  Administered 2022-03-27: 290 mg via INTRAVENOUS
  Filled 2022-03-27: qty 7.25

## 2022-03-27 MED ORDER — WITCH HAZEL-GLYCERIN EX PADS
1.0000 | MEDICATED_PAD | CUTANEOUS | Status: DC | PRN
Start: 2022-03-27 — End: 2022-03-29

## 2022-03-27 MED ORDER — GENTAMICIN SULFATE 40 MG/ML IJ SOLN
5.0000 mg/kg | INTRAVENOUS | Status: AC
  Administered 2022-03-28: 290 mg via INTRAVENOUS
  Filled 2022-03-27: qty 7.25

## 2022-03-27 MED ORDER — SODIUM CHLORIDE 0.9 % IV SOLN
2.0000 g | Freq: Four times a day (QID) | INTRAVENOUS | Status: AC
  Administered 2022-03-28 (×3): 2 g via INTRAVENOUS
  Filled 2022-03-27 (×3): qty 2000

## 2022-03-27 MED ORDER — SODIUM CHLORIDE 0.9% FLUSH: 3.0000 mL | INTRAVENOUS | Status: DC | PRN

## 2022-03-27 MED ORDER — SODIUM CHLORIDE 0.9 % IV SOLN: 500.0000 mg | INTRAVENOUS | Status: DC

## 2022-03-27 MED ORDER — FENTANYL-BUPIVACAINE-NACL 0.5-0.125-0.9 MG/250ML-% EP SOLN
12.0000 mL/h | EPIDURAL | Status: DC | PRN
  Administered 2022-03-27 (×2): 12 mL/h via EPIDURAL
  Filled 2022-03-27: qty 250

## 2022-03-27 MED ORDER — SCOPOLAMINE 1 MG/3DAYS TD PT72
1.0000 | MEDICATED_PATCH | Freq: Once | TRANSDERMAL | Status: DC
Start: 2022-03-27 — End: 2022-03-29

## 2022-03-27 MED ORDER — SODIUM CHLORIDE 0.9 % IV SOLN
2.0000 g | Freq: Four times a day (QID) | INTRAVENOUS | Status: DC
  Administered 2022-03-27: 2 g via INTRAVENOUS
  Filled 2022-03-27: qty 2000

## 2022-03-27 MED ORDER — DIPHENHYDRAMINE HCL 25 MG PO CAPS
25.0000 mg | ORAL_CAPSULE | ORAL | Status: DC | PRN
Start: 2022-03-27 — End: 2022-03-29

## 2022-03-27 MED ORDER — PHENYLEPHRINE 80 MCG/ML (10ML) SYRINGE FOR IV PUSH (FOR BLOOD PRESSURE SUPPORT): 80.0000 ug | PREFILLED_SYRINGE | INTRAVENOUS | Status: DC | PRN

## 2022-03-27 MED ORDER — DEXAMETHASONE SODIUM PHOSPHATE 10 MG/ML IJ SOLN
INTRAMUSCULAR | Status: AC
  Filled 2022-03-27: qty 1

## 2022-03-27 MED ORDER — METHYLERGONOVINE MALEATE 0.2 MG/ML IJ SOLN
INTRAMUSCULAR | Status: DC | PRN
  Administered 2022-03-27: .2 mg via INTRAMUSCULAR

## 2022-03-27 MED ORDER — DIBUCAINE (PERIANAL) 1 % EX OINT
1.0000 | TOPICAL_OINTMENT | CUTANEOUS | Status: DC | PRN
Start: 2022-03-27 — End: 2022-03-29

## 2022-03-27 MED ORDER — TERBUTALINE SULFATE 1 MG/ML IJ SOLN: 0.2500 mg | Freq: Once | INTRAMUSCULAR | Status: DC | PRN

## 2022-03-27 MED ORDER — CEFAZOLIN SODIUM-DEXTROSE 2-4 GM/100ML-% IV SOLN
2.0000 g | INTRAVENOUS | Status: AC
  Administered 2022-03-27: 2 g via INTRAVENOUS

## 2022-03-27 MED ORDER — SOD CITRATE-CITRIC ACID 500-334 MG/5ML PO SOLN
30.0000 mL | ORAL | Status: AC
  Administered 2022-03-27: 30 mL via ORAL

## 2022-03-27 MED ORDER — NALOXONE HCL 4 MG/10ML IJ SOLN
1.0000 ug/kg/h | INTRAVENOUS | Status: DC | PRN
  Filled 2022-03-27: qty 5

## 2022-03-27 MED ORDER — OXYTOCIN-SODIUM CHLORIDE 30-0.9 UT/500ML-% IV SOLN
1.0000 m[IU]/min | INTRAVENOUS | Status: DC
  Administered 2022-03-27: 2 m[IU]/min via INTRAVENOUS
  Filled 2022-03-27: qty 500

## 2022-03-27 MED ORDER — CEFAZOLIN SODIUM-DEXTROSE 2-3 GM-%(50ML) IV SOLR
INTRAVENOUS | Status: DC | PRN
Start: 2022-03-27 — End: 2022-03-27

## 2022-03-27 MED ORDER — TRANEXAMIC ACID-NACL 1000-0.7 MG/100ML-% IV SOLN
INTRAVENOUS | Status: AC
  Filled 2022-03-27: qty 100

## 2022-03-27 MED ORDER — LIDOCAINE-EPINEPHRINE (PF) 2 %-1:200000 IJ SOLN
INTRAMUSCULAR | Status: DC | PRN
  Administered 2022-03-27: 5 mL via EPIDURAL

## 2022-03-27 MED ORDER — COCONUT OIL OIL: 1.0000 "application " | TOPICAL_OIL | Status: DC | PRN

## 2022-03-27 MED ORDER — DIPHENHYDRAMINE HCL 50 MG/ML IJ SOLN: 12.5000 mg | INTRAMUSCULAR | Status: DC | PRN

## 2022-03-27 MED ORDER — MORPHINE SULFATE (PF) 10 MG/ML IV SOLN
INTRAVENOUS | Status: DC | PRN
  Administered 2022-03-27: 3 mg via EPIDURAL

## 2022-03-27 MED ORDER — OXYCODONE HCL 5 MG PO TABS: 5.0000 mg | ORAL_TABLET | ORAL | Status: DC | PRN

## 2022-03-27 MED ORDER — MORPHINE SULFATE (PF) 0.5 MG/ML IJ SOLN
INTRAMUSCULAR | Status: AC
  Filled 2022-03-27: qty 10

## 2022-03-27 MED ORDER — ACETAMINOPHEN 500 MG PO TABS
1000.0000 mg | ORAL_TABLET | Freq: Four times a day (QID) | ORAL | Status: DC
  Administered 2022-03-28 – 2022-03-29 (×4): 1000 mg via ORAL
  Filled 2022-03-27 (×4): qty 2

## 2022-03-27 MED ORDER — DIPHENHYDRAMINE HCL 25 MG PO CAPS: 25.0000 mg | ORAL_CAPSULE | Freq: Four times a day (QID) | ORAL | Status: DC | PRN

## 2022-03-27 MED ORDER — ACETAMINOPHEN 10 MG/ML IV SOLN
INTRAVENOUS | Status: DC | PRN
  Administered 2022-03-27: 1000 mg via INTRAVENOUS

## 2022-03-27 MED ORDER — SIMETHICONE 80 MG PO CHEW: 80.0000 mg | CHEWABLE_TABLET | ORAL | Status: DC | PRN

## 2022-03-27 MED ORDER — ACETAMINOPHEN 10 MG/ML IV SOLN: 1000.0000 mg | Freq: Once | INTRAVENOUS | Status: DC | PRN

## 2022-03-27 MED ORDER — METOCLOPRAMIDE HCL 5 MG/ML IJ SOLN
INTRAMUSCULAR | Status: DC | PRN
  Administered 2022-03-27: 10 mg via INTRAVENOUS

## 2022-03-27 MED ORDER — EPHEDRINE 5 MG/ML INJ: 10.0000 mg | INTRAVENOUS | Status: DC | PRN

## 2022-03-27 MED ORDER — FENTANYL CITRATE (PF) 100 MCG/2ML IJ SOLN
INTRAMUSCULAR | Status: AC
Start: 2022-03-27 — End: ?
  Filled 2022-03-27: qty 2

## 2022-03-27 MED ORDER — OXYTOCIN-SODIUM CHLORIDE 30-0.9 UT/500ML-% IV SOLN
INTRAVENOUS | Status: AC
  Filled 2022-03-27: qty 500

## 2022-03-27 MED ORDER — SIMETHICONE 80 MG PO CHEW
80.0000 mg | CHEWABLE_TABLET | Freq: Three times a day (TID) | ORAL | Status: DC
  Administered 2022-03-28 – 2022-03-29 (×3): 80 mg via ORAL
  Filled 2022-03-27 (×4): qty 1

## 2022-03-27 MED ORDER — KETOROLAC TROMETHAMINE 30 MG/ML IJ SOLN
30.0000 mg | Freq: Four times a day (QID) | INTRAMUSCULAR | Status: DC | PRN
  Administered 2022-03-27: 30 mg via INTRAVENOUS

## 2022-03-27 MED ORDER — ONDANSETRON HCL 4 MG/2ML IJ SOLN
INTRAMUSCULAR | Status: AC
  Filled 2022-03-27: qty 2

## 2022-03-27 MED ORDER — FENTANYL CITRATE (PF) 100 MCG/2ML IJ SOLN
INTRAMUSCULAR | Status: DC | PRN
  Administered 2022-03-27: 100 ug via EPIDURAL

## 2022-03-27 MED ORDER — KETOROLAC TROMETHAMINE 30 MG/ML IJ SOLN: 30.0000 mg | Freq: Four times a day (QID) | INTRAMUSCULAR | Status: DC | PRN

## 2022-03-27 MED ORDER — LACTATED RINGERS IV SOLN: INTRAVENOUS | Status: DC

## 2022-03-27 MED ORDER — SODIUM CHLORIDE 0.9 % IV SOLN
INTRAVENOUS | Status: DC | PRN
  Administered 2022-03-27: 1000 mg via INTRAVENOUS

## 2022-03-27 MED ORDER — LACTATED RINGERS IV SOLN: 500.0000 mL | Freq: Once | INTRAVENOUS | Status: DC

## 2022-03-27 MED ORDER — NALOXONE HCL 0.4 MG/ML IJ SOLN: 0.4000 mg | INTRAMUSCULAR | Status: DC | PRN

## 2022-03-27 MED ORDER — MENTHOL 3 MG MT LOZG: 1.0000 | LOZENGE | OROMUCOSAL | Status: DC | PRN

## 2022-03-27 MED ORDER — SODIUM CHLORIDE 0.9 % IV SOLN
INTRAVENOUS | Status: DC | PRN
  Administered 2022-03-27: 500 mg via INTRAVENOUS

## 2022-03-27 MED ORDER — SODIUM CHLORIDE 0.9 % IR SOLN
Status: DC | PRN
  Administered 2022-03-27: 1000 mL

## 2022-03-27 MED ORDER — METOCLOPRAMIDE HCL 5 MG/ML IJ SOLN
INTRAMUSCULAR | Status: AC
Start: 2022-03-27 — End: ?
  Filled 2022-03-27: qty 2

## 2022-03-27 MED ORDER — KETOROLAC TROMETHAMINE 30 MG/ML IJ SOLN
30.0000 mg | Freq: Four times a day (QID) | INTRAMUSCULAR | Status: DC
  Administered 2022-03-28 (×2): 30 mg via INTRAVENOUS
  Filled 2022-03-27 (×2): qty 1

## 2022-03-27 MED ORDER — OXYTOCIN-SODIUM CHLORIDE 30-0.9 UT/500ML-% IV SOLN: 2.5000 [IU]/h | INTRAVENOUS | Status: AC

## 2022-03-27 MED ORDER — ENOXAPARIN SODIUM 40 MG/0.4ML IJ SOSY
40.0000 mg | PREFILLED_SYRINGE | INTRAMUSCULAR | Status: DC
  Administered 2022-03-28 – 2022-03-29 (×2): 40 mg via SUBCUTANEOUS
  Filled 2022-03-27 (×2): qty 0.4

## 2022-03-27 SURGICAL SUPPLY — 30 items
BENZOIN TINCTURE PRP APPL 2/3 (GAUZE/BANDAGES/DRESSINGS) IMPLANT
CHLORAPREP W/TINT 26 (MISCELLANEOUS) ×4 IMPLANT
CLAMP UMBILICAL CORD (MISCELLANEOUS) ×2 IMPLANT
CLOTH BEACON ORANGE TIMEOUT ST (SAFETY) ×2 IMPLANT
DRSG OPSITE POSTOP 4X10 (GAUZE/BANDAGES/DRESSINGS) ×2 IMPLANT
ELECT REM PT RETURN 9FT ADLT (ELECTROSURGICAL) ×2
ELECTRODE REM PT RTRN 9FT ADLT (ELECTROSURGICAL) ×1 IMPLANT
EXTRACTOR VACUUM KIWI (MISCELLANEOUS) IMPLANT
GLOVE SURG ORTHO 8.0 STRL STRW (GLOVE) ×2 IMPLANT
GOWN STRL REUS W/TWL LRG LVL3 (GOWN DISPOSABLE) ×4 IMPLANT
HEMOSTAT ARISTA ABSORB 3G PWDR (HEMOSTASIS) ×1 IMPLANT
KIT ABG SYR 3ML LUER SLIP (SYRINGE) IMPLANT
NDL HYPO 25X5/8 SAFETYGLIDE (NEEDLE) IMPLANT
NEEDLE HYPO 25X5/8 SAFETYGLIDE (NEEDLE) IMPLANT
NS IRRIG 1000ML POUR BTL (IV SOLUTION) ×2 IMPLANT
PACK C SECTION WH (CUSTOM PROCEDURE TRAY) ×2 IMPLANT
PAD OB MATERNITY 4.3X12.25 (PERSONAL CARE ITEMS) ×2 IMPLANT
RTRCTR C-SECT PINK 25CM LRG (MISCELLANEOUS) IMPLANT
STRIP CLOSURE SKIN 1/2X4 (GAUZE/BANDAGES/DRESSINGS) IMPLANT
SUT MON AB-0 CT1 36 (SUTURE) ×5 IMPLANT
SUT PLAIN 0 NONE (SUTURE) IMPLANT
SUT VIC AB 0 CT1 27 (SUTURE) ×2
SUT VIC AB 0 CT1 27XBRD ANBCTR (SUTURE) ×2 IMPLANT
SUT VIC AB 2-0 CT1 27 (SUTURE) ×1
SUT VIC AB 2-0 CT1 TAPERPNT 27 (SUTURE) ×1 IMPLANT
SUT VIC AB 4-0 SH 27 (SUTURE) ×1
SUT VIC AB 4-0 SH 27XANBCTRL (SUTURE) ×1 IMPLANT
TOWEL OR 17X24 6PK STRL BLUE (TOWEL DISPOSABLE) ×2 IMPLANT
TRAY FOLEY W/BAG SLVR 14FR LF (SET/KITS/TRAYS/PACK) ×2 IMPLANT
WATER STERILE IRR 1000ML POUR (IV SOLUTION) ×2 IMPLANT

## 2022-03-27 NOTE — Progress Notes (Signed)
Labor Progress Note ?Jennifer Acosta is a 31 y.o. G2P1001 at [redacted]w[redacted]d presented for IOL/TOLAC due to BPP 6/8 (low AFI) ? ?S: Doing well, feeling a lot better after her epidural.   ? ?O:  ?BP 120/78   Pulse 88   Temp 98.9 ?F (37.2 ?C) (Oral)   Resp 16   Ht 5\' 1"  (1.549 m)   Wt 74.9 kg   BMI 31.20 kg/m?  ?EFM: baseline 150s/moderate variability/+ accels, no decels  ? ?CVE: Dilation: 7.5 ?Effacement (%): 90 ?Cervical Position: Posterior ?Station: -2 ?Presentation: Vertex ?Exam by:: Dr Higinio Plan ? ? ?A&P: 31 y.o. G2P1001 [redacted]w[redacted]d  ? ?#Labor: Progressing well. IUPC placed, baby and mom tolerated this well ?#Pain: Epidural ?#FWB: Cat 1  ?#GBS negative ? ? ?Noralee Stain, DO, PGY-1 ?9:37 AM  ?

## 2022-03-27 NOTE — Progress Notes (Signed)
Labor Progress Note ?Jennifer Acosta is a 31 y.o. G2P1001 at [redacted]w[redacted]d who presented for IOL/TOLAC due to BPP 6/8 (low AFI).  ? ?S: Doing well. Breathing through contractions. No concerns.  ? ?O:  ?BP 117/72   Pulse 65   Temp 98.8 ?F (37.1 ?C) (Oral)   Resp 16   Ht 5\' 1"  (1.549 m)   Wt 74.9 kg   BMI 31.20 kg/m?  ? ?EFM: Baseline 145 bpm, moderate variability, + accels, early/variable decels  ?Toco: Every 4-5 minutes  ? ?CVE: Dilation: 6 ?Effacement (%): 80 ?Cervical Position: Posterior ?Station: -1 ?Presentation: Vertex ?Exam by:: Dr. [email or web address] ? ?A&P: 31 y.o. G2P1001 [redacted]w[redacted]d  ? ?#Labor: Balloon noted to be in vaginal vault upon exam. Balloon deflated and removed. SVE 6/80/-1 with very pliable cervix. Pitocin recently started due to contractions every 4-5 minutes. AROM performed this check with scant amount of clear fluid. Mom and baby tolerated this well. Will continue Pitocin and reassess in 2-3 hours, sooner as needed.  ?#Pain: Epidural requested, will notify anesthesia  ?#FWB: Cat 2 due to occasional variable decels. Continues to have accels and moderate variability. Will continue to monitor. Plan to place IUPC and start amnioinfusion if variable decels become more frequent.  ?#GBS negative ? ?[redacted]w[redacted]d, MD ?4:31 AM ? ?

## 2022-03-27 NOTE — Anesthesia Procedure Notes (Signed)
Epidural ?Patient location during procedure: OB ?Start time: 03/27/2022 5:15 AM ?End time: 03/27/2022 5:25 AM ? ?Staffing ?Anesthesiologist: Freddrick March, MD ?Performed: anesthesiologist  ? ?Preanesthetic Checklist ?Completed: patient identified, IV checked, risks and benefits discussed, monitors and equipment checked, pre-op evaluation and timeout performed ? ?Epidural ?Patient position: sitting ?Prep: DuraPrep and site prepped and draped ?Patient monitoring: continuous pulse ox, blood pressure, heart rate and cardiac monitor ?Approach: midline ?Location: L3-L4 ?Injection technique: LOR air ? ?Needle:  ?Needle type: Tuohy  ?Needle gauge: 17 G ?Needle length: 9 cm ?Needle insertion depth: 5.5 cm ?Catheter type: closed end flexible ?Catheter size: 19 Gauge ?Catheter at skin depth: 11 cm ?Test dose: negative ? ?Assessment ?Sensory level: T8 ?Events: blood not aspirated, injection not painful, no injection resistance, no paresthesia and negative IV test ? ?Additional Notes ?Patient identified. Risks/Benefits/Options discussed with patient including but not limited to bleeding, infection, nerve damage, paralysis, failed block, incomplete pain control, headache, blood pressure changes, nausea, vomiting, reactions to medication both or allergic, itching and postpartum back pain. Confirmed with bedside nurse the patient's most recent platelet count. Confirmed with patient that they are not currently taking any anticoagulation, have any bleeding history or any family history of bleeding disorders. Patient expressed understanding and wished to proceed. All questions were answered. Sterile technique was used throughout the entire procedure. Please see nursing notes for vital signs. Test dose was given through epidural catheter and negative prior to continuing to dose epidural or start infusion. Warning signs of high block given to the patient including shortness of breath, tingling/numbness in hands, complete motor block,  or any concerning symptoms with instructions to call for help. Patient was given instructions on fall risk and not to get out of bed. All questions and concerns addressed with instructions to call with any issues or inadequate analgesia.  Reason for block:procedure for pain ? ? ? ?

## 2022-03-27 NOTE — Discharge Summary (Signed)
? ?  Postpartum Discharge Summary ? ?   ?Patient Name: Jennifer Acosta ?DOB: Nov 01, 1991 ?MRN: [id number] ? ?Date of admission: 03/26/2022 ?Delivery date:03/27/2022  ?Delivering provider: Lynnda Shields A  ?Date of discharge: 03/29/2022 ? ?Admitting diagnosis: Post term pregnancy over 40 weeks [O48.0] ?Intrauterine pregnancy: [redacted]w[redacted]d    ?Secondary diagnosis:  Principal Problem: ?  Status post repeat low transverse cesarean section ?Active Problems: ?  Post term pregnancy over 40 weeks ?  Chorioamnionitis ? ?Additional problems: Acute blood loss anemia ( 11.6>10.0)    ?Discharge diagnosis: Term Pregnancy Delivered                                              ?Post partum procedures:  None ?Augmentation: AROM, Pitocin, and IP Foley ?Complications: Intrauterine Inflammation or infection (Chorioamniotis) ? ?Hospital course: Induction of Labor With Cesarean Section   ?31y.o. yo G2P1001 at 435w6das admitted to the hospital 03/26/2022 for induction of labor. Patient started her induction with foley balloon placement, followed by Pitocin and AROM. Patient progressed to 7.5 cm without further cervical change despite having adequate contractions. She developed an AKI with decreased urinary output while in labor and was started on Amp/Gent for presumed triple I. The patient went for cesarean section due to Arrest of Dilation. Delivery details are as follows: ?Membrane Rupture Time/Date: 4:20 AM ,03/27/2022   ?Delivery Method:C-Section, Low Transverse  ?Details of operation can be found in separate operative note.  Patient had an uncomplicated postpartum course. She was continued on Amp/Gent for triple I for 24 hours postpartum and remained afebrile.  Her hemoglobin on POD#1 was 10.0, from which she was asymptomatic.  Her urine output returned to normal and her creatinine improved from 1.44>0.97 after delivery.  She is ambulating, tolerating a regular diet, passing flatus, and urinating well.  Her pain and bleeding are controlled.   Patient is discharged home in stable condition on 03/29/22.     ? ?Newborn Data: ?Birth date:03/27/2022  ?Birth time:8:38 PM  ?Gender:Female  ?Living status:Living  ?Apgars:8 ,9  ?Weight:3250 g                               ? ?Magnesium Sulfate received: No ?BMZ received: No ?Rhophylac: N/A ?MMR: N/A ?T-DaP: Given prenatally ?Flu: N/A ?Transfusion: No ? ?Physical exam  ?Vitals:  ? 03/28/22 0845 03/28/22 1330 03/28/22 2030 03/29/22 [id number]?BP: 96/68 101/60 122/76 105/71  ?Pulse: 82 88 81 70  ?Resp: _0 ?Temp: 98.1 ?F (36.7 ?C) 97.9 ?F (36.6 ?C) 98.7 ?F (37.1 ?C) 98.6 ?F (37 ?C)  ?TempSrc: Oral Oral Oral Oral  ?SpO2: 97% 99% 98% 99%  ?Weight:      ?Height:      ? ?General: alert, cooperative, and no distress ?Lochia: appropriate ?Uterine Fundus: firm and below umbilicus  ?Incision: healing well with no significant drainage, no significant erythema, dressing is clean, dry, and intact ?DVT Evaluation: no LE edema or calf tenderness to palpation  ? ?Labs: ?Lab Results  ?Component Value Date  ? WBC 26.8 (H) 03/28/2022  ? HGB 10.0 (L) 03/28/2022  ? HCT 30.3 (L) 03/28/2022  ? MCV 97.1 03/28/2022  ? PLT 233 03/28/2022  ? ? ?  Latest Ref Rng & Units 03/29/2022  ?  4:13 AM  ?CMP  ?Glucose 70 -  99 mg/dL 63    ?BUN 6 - 20 mg/dL 12    ?Creatinine 0.44 - 1.00 mg/dL 0.97    ?Sodium 135 - 145 mmol/L 140    ?Potassium 3.5 - 5.1 mmol/L 3.5    ?Chloride 98 - 111 mmol/L 108    ?CO2 22 - 32 mmol/L 23    ?Calcium 8.9 - 10.3 mg/dL 8.1    ? ?Edinburgh Score: ? ?  03/28/2022  ?  6:28 PM  ?Flavia Shipper Postnatal Depression Scale Screening Tool  ?I have been able to laugh and see the funny side of things. 0  ?I have looked forward with enjoyment to things. 0  ?I have blamed myself unnecessarily when things went wrong. 0  ?I have been anxious or worried for no good reason. 0  ?I have felt scared or panicky for no good reason. 0  ?Things have been getting on top of me. 0  ?I have been so unhappy that I have had difficulty sleeping. 0  ?I have felt  sad or miserable. 0  ?I have been so unhappy that I have been crying. 0  ?The thought of harming myself has occurred to me. 0  ?Edinburgh Postnatal Depression Scale Total 0  ? ? ? ?After visit meds:  ?Allergies as of 03/29/2022   ?No Known Allergies ?  ? ?  ?Medication List  ?  ? ?STOP taking these medications   ? ?methylPREDNISolone 4 MG Tbpk tablet ?Commonly known as: MEDROL DOSEPAK ?  ? ?  ? ?TAKE these medications   ? ?acetaminophen 500 MG tablet ?Commonly known as: TYLENOL ?Take 2 tablets (1,000 mg total) by mouth every 8 (eight) hours as needed (pain). ?  ?fluticasone 50 MCG/ACT nasal spray ?Commonly known as: FLONASE ?Place 2 sprays into both nostrils daily. ?  ?ibuprofen 600 MG tablet ?Commonly known as: ADVIL ?Take 1 tablet (600 mg total) by mouth every 6 (six) hours as needed (pain). ?  ?norethindrone 0.35 MG tablet ?Commonly known as: MICRONOR ?Take 1 tablet (0.35 mg total) by mouth daily. For birth control. ?  ?oxyCODONE 5 MG immediate release tablet ?Commonly known as: Oxy IR/ROXICODONE ?Take 1 tablet (5 mg total) by mouth every 6 (six) hours as needed for severe pain or breakthrough pain. ?  ? ?  ? ?  ?  ? ? ?  ?Discharge Care Instructions  ?(From admission, onward)  ?  ? ? ?  ? ?  Start     Ordered  ? 03/29/22 0000  Discharge wound care:       ?Comments: Remove dressing 5 days after your surgery date. You can then wash the area gently with soap and water in the shower and pat dry. You will have an incision check in about 1 week.  ? 03/29/22 0918  ? ?  ?  ? ?  ? ? ? ?Discharge home in stable condition ?Infant Feeding: Bottle and Breast ?Infant Disposition: home with mother ?Discharge instruction: per After Visit Summary and Postpartum booklet. ?Activity: Advance as tolerated. Pelvic rest for 6 weeks.  ?Diet: routine diet ?Future Appointments: ?Future Appointments  ?Date Time Provider [place]  ?04/04/2022 10:20 AM [facility] NURSE [facility]  ? ?Follow up Visit: ?Patient will follow up at [facility] in  4-6 weeks for postpartum visit. Message sent to [facility] for incision check in 1 week.  ? ?Additional Postpartum F/U: Incision check 1 week  ?High risk pregnancy complicated by: Hx of CS ?Delivery mode:  C-Section, Low Transverse  ?Anticipated  Birth Control:  POPs ? ?03/29/2022 ?Genia Del, MD ? ? ? ?

## 2022-03-27 NOTE — Progress Notes (Signed)
Re-take oral temp 100.52F shortly after eating some ice. Vaginal canal felt warm on exam. No fetal tachycardia, but baseline has been increasing. Now around 155bpm.   ? ?Will presumptively treat as triple I with amp and gent.  ? ?Allayne Stack, DO  ?

## 2022-03-27 NOTE — Anesthesia Preprocedure Evaluation (Signed)
Anesthesia Evaluation  ?Patient identified by MRN, date of birth, ID band ?Patient awake ? ? ? ?Reviewed: ?Allergy & Precautions, NPO status , Patient's Chart, lab work & pertinent test results ? ?Airway ?Mallampati: II ? ?TM Distance: >3 FB ?Neck ROM: Full ? ? ? Dental ?no notable dental hx. ? ?  ?Pulmonary ?neg pulmonary ROS,  ?  ?Pulmonary exam normal ?breath sounds clear to auscultation ? ? ? ? ? ? Cardiovascular ?negative cardio ROS ?Normal cardiovascular exam ?Rhythm:Regular Rate:Normal ? ? ?  ?Neuro/Psych ?negative neurological ROS ? negative psych ROS  ? GI/Hepatic ?negative GI ROS, Neg liver ROS,   ?Endo/Other  ?negative endocrine ROS ? Renal/GU ?negative Renal ROS  ?negative genitourinary ?  ?Musculoskeletal ?negative musculoskeletal ROS ?(+)  ? Abdominal ?  ?Peds ? Hematology ?negative hematology ROS ?(+)   ?Anesthesia Other Findings ?IOL for BPP 6/8, TOLAC ? Reproductive/Obstetrics ?(+) Pregnancy ? ?  ? ? ? ? ? ? ? ? ? ? ? ? ? ?  ?  ? ? ? ? ? ? ? ? ?Anesthesia Physical ?Anesthesia Plan ? ?ASA: 2 ? ?Anesthesia Plan: Epidural  ? ?Post-op Pain Management:   ? ?Induction:  ? ?PONV Risk Score and Plan: Treatment may vary due to age or medical condition ? ?Airway Management Planned: Natural Airway ? ?Additional Equipment:  ? ?Intra-op Plan:  ? ?Post-operative Plan:  ? ?Informed Consent: I have reviewed the patients History and Physical, chart, labs and discussed the procedure including the risks, benefits and alternatives for the proposed anesthesia with the patient or authorized representative who has indicated his/her understanding and acceptance.  ? ? ? ? ? ?Plan Discussed with: Anesthesiologist ? ?Anesthesia Plan Comments: (Patient identified. Risks, benefits, options discussed with patient including but not limited to bleeding, infection, nerve damage, paralysis, failed block, incomplete pain control, headache, blood pressure changes, nausea, vomiting, reactions to  medication, itching, and post partum back pain. Confirmed with bedside nurse the patient's most recent platelet count. Confirmed with the patient that they are not taking any anticoagulation, have any bleeding history or any family history of bleeding disorders. Patient expressed understanding and wishes to proceed. All questions were answered. )  ? ? ? ? ? ? ?Anesthesia Quick Evaluation ? ?

## 2022-03-27 NOTE — Plan of Care (Signed)
?  Problem: Education: ?Goal: Knowledge of Childbirth will improve ?Outcome: Adequate for Discharge ?Goal: Ability to make informed decisions regarding treatment and plan of care will improve ?Outcome: Adequate for Discharge ?Goal: Ability to state and carry out methods to decrease the pain will improve ?Outcome: Adequate for Discharge ?Goal: Individualized Educational Video(s) ?Outcome: Not Applicable ?  ?Problem: Coping: ?Goal: Ability to verbalize concerns and feelings about labor and delivery will improve ?Outcome: Adequate for Discharge ?  ?Problem: Life Cycle: ?Goal: Ability to make normal progression through stages of labor will improve ?Outcome: Adequate for Discharge ?Goal: Ability to effectively push during vaginal delivery will improve ?Outcome: Not Progressing ?  ?Problem: Role Relationship: ?Goal: Will demonstrate positive interactions with the child ?Outcome: Adequate for Discharge ?  ?Problem: Safety: ?Goal: Risk of complications during labor and delivery will decrease ?Outcome: Adequate for Discharge ?  ?Problem: Pain Management: ?Goal: Relief or control of pain from uterine contractions will improve ?Outcome: Adequate for Discharge ?  ?

## 2022-03-27 NOTE — Progress Notes (Signed)
Labor Progress Note  ? ?Checked in on patient. Last checked by RN 3 hours ago and unchanged, about 7.5/90/-2 on pit 14 but with inadequate contractions.  ? ?FHT has been intermittently Cat I-II (occasional one late or variable that resolve spontaneously), but with reassuring moderate variability.  ? ?Checked cervix, unchanged. Now with moderate to thick meconium on glove, looks like light meconium through IUPC.  ? ?Oral temp earlier 100.7>100.16F without anti-pyretic. No fetal tachycardia. Ruptured for 11 hours now. Plan to recheck temp in 15 minutes, if still elevated plan to start antibiotics for presumed Triple I.  ? ?Additionally minimal urine output with AKI, likely in setting of urethral compression from fetal head, working towards delivery. Titrating pit upwards.  ? ?Dr. Adrian Blackwater made aware.  ? ?Patient is aware that we will proceed with repeat C/S if she continues to be unchanged despite adequate contractions, unable to increase d/t FHT, or persistent Cat II tracing.   ? ?Allayne Stack, DO  ?

## 2022-03-27 NOTE — Transfer of Care (Signed)
Immediate Anesthesia Transfer of Care Note ? ?Patient: Jennifer Acosta ? ?Procedure(s) Performed: CESAREAN SECTION ? ?Patient Location: PACU ? ?Anesthesia Type:Epidural ? ?Level of Consciousness: awake, alert , oriented and patient cooperative ? ?Airway & Oxygen Therapy: Patient Spontanous Breathing ? ?Post-op Assessment: Report given to RN and Post -op Vital signs reviewed and stable ? ?Post vital signs: Reviewed and stable ? ?Last Vitals:  ?Vitals Value Taken Time  ?BP 118/86 03/27/22 2130  ?Temp 37.2 ?C 03/27/22 2125  ?Pulse 100 03/27/22 2136  ?Resp 20 03/27/22 2136  ?SpO2 98 % 03/27/22 2136  ?Vitals shown include unvalidated device data. ? ?Last Pain:  ?Vitals:  ? 03/27/22 2130  ?TempSrc:   ?PainSc: 0-No pain  ?   ? ?Patients Stated Pain Goal: 1 (03/27/22 0534) ? ?Complications: No notable events documented. ?

## 2022-03-27 NOTE — Progress Notes (Signed)
Labor Progress Note:  ? ?Evaluated patient at bedside with Dr. Donavan Foil and doing well overall. Checked cervix and unchanged from earlier. She has been 7.5cm all day with frequent contractions on pit (intermittently adequate throughout the day). In addition, she recently has developed Triple I on amp/gent + increasing fetal tachycardia and minimal UOP for the entire day associated with AKI.  ? ?Discussed recommendation for C/S due to the above indications which she agrees to do.  ? ?The risks of cesarean section discussed with the patient included but were not limited to: bleeding which may require transfusion or reoperation; infection which may require antibiotics; injury to bowel, bladder, ureters or other surrounding organs; injury to the fetus; need for additional procedures including hysterectomy in the event of a life-threatening hemorrhage; placental abnormalities wth subsequent pregnancies, incisional problems, thromboembolic phenomenon and other postoperative/anesthesia complications. The patient concurred with the proposed plan, giving informed written consent for the procedure. Anesthesia and OR aware. Preoperative prophylactic antibiotics and SCDs ordered on call to the OR. To OR when ready.   ? ?Allayne Stack, DO  ?

## 2022-03-27 NOTE — Op Note (Addendum)
Rudolpho Sevin ? ?PROCEDURE DATE: 03/27/2022 ? ?PREOPERATIVE DIAGNOSES: Intrauterine pregnancy at [redacted]w[redacted]d weeks gestation; arrest of dilation; chorioamnionitis; history of cesarean section ? ?POSTOPERATIVE DIAGNOSES: The same ? ?PROCEDURE: Repeat Low Transverse Cesarean Section ? ?SURGEON:  Dr. Lynnda Shields ? ?ASSISTANT:  Dr. Vilma Meckel  ?An experienced assistant was required given the standard of surgical care given the complexity of the case.  This assistant was needed for exposure, dissection, suctioning, retraction, instrument exchange,  assisting with delivery with administration of fundal pressure, and for overall help during the procedure. ? ? ?ANESTHESIOLOGY TEAM: Anesthesiologist: Janeece Riggers, MD; Freddrick March, MD ?CRNA: Alvy Bimler, CRNA ? ?INDICATIONS: Jennifer Acosta is a 31 y.o. G2P1001 at [redacted]w[redacted]d here for cesarean section secondary to the indications listed under preoperative diagnoses; please see preoperative note for further details.  The risks of cesarean section were discussed with the patient including but were not limited to: bleeding which may require transfusion or reoperation; infection which may require antibiotics; injury to bowel, bladder, ureters or other surrounding organs; injury to the fetus; need for additional procedures including hysterectomy in the event of a life-threatening hemorrhage; placental abnormalities wth subsequent pregnancies, incisional problems, thromboembolic phenomenon and other postoperative/anesthesia complications.   The patient concurred with the proposed plan, giving informed written consent for the procedure.   ? ?FINDINGS:  Viable female infant in cephalic presentation.  Apgars 8 and 9.  Thick meconium stained amniotic fluid.  Intact placenta, three vessel cord.  Normal uterus.  Minimal adhesive disease present.  ? ?ANESTHESIA: Epidural  ?INTRAVENOUS FLUIDS: 1500 ml   ?ESTIMATED BLOOD LOSS: 723 ml ?URINE OUTPUT:  200 ml ?SPECIMENS:  Placenta sent to pathology ?COMPLICATIONS: None immediate ? ?PROCEDURE IN DETAIL:   ?The patient preoperatively received intravenous antibiotics and had sequential compression devices applied to her lower extremities.  She was then taken to the operating room where the epidural anesthesia was dosed up to surgical level and was found to be adequate. She was then placed in a dorsal supine position with a leftward tilt, and prepped and draped in a sterile manner.  A foley catheter was in place and attached to constant gravity.   ? ?After an adequate timeout was performed, a Pfannenstiel skin incision was made with a scalpel just above her preexisting scar and carried through to the underlying layer of fascia. The fascia was incised in the midline, and this incision was extended bilaterally using the Mayo scissors.  Kocher clamps were applied to the superior aspect of the fascial incision and the underlying rectus muscles were dissected off bluntly.  A similar process was carried out on the inferior aspect of the fascial incision. The rectus muscles were separated in the midline and the peritoneum was entered bluntly. The Alexis self-retaining retractor was introduced into the abdominal cavity.   ? ?Attention was turned to the lower uterine segment where a low transverse hysterotomy was made with a scalpel and extended bilaterally bluntly.  The infant was successfully delivered, the cord was clamped and cut after one minute, and the infant was handed over to the awaiting neonatology team. Uterine massage was then administered, and the placenta delivered intact with a three-vessel cord. The uterus was then cleared of clots and debris.   ? ?The hysterotomy was closed with 0 Monocryl in a running locked fashion.  Figure-of-eight 0 Monocryl serosal stitches were placed to help with hemostasis.  Methergine was given to help with uterine tone with subsequent improvement.  The pelvis was cleared of all  clot and debris.  Hemostasis was confirmed on all surfaces.  Arista was applied.  The retractor was removed.   ? ?The peritoneum was closed with a 2-0 Vicryl running stitch.  The fascia was then closed using 0 Vicryl in a running fashion.  The subcutaneous layer was irrigated and the skin was closed with a 4-0 Vicryl subcuticular stitch. The patient tolerated the procedure well. Sponge, instrument and needle counts were correct x 3.  She was taken to the recovery room in stable condition.  ? ?Vilma Meckel, MD ?[facility] Fellow  Faculty Practice ? ?Attestation of Attending Supervision of Obstetric Fellow: Evaluation and management procedures were performed by the Obstetric Fellow under my supervision and collaboration.  I have reviewed the Obstetric Fellow's note and chart, and I agree with the management and plan. I have also made any necessary editorial changes. ? ? ?Griffin Basil, MD ?Attending Obstetrician & Gynecologist, Faculty Practice ?[facility] [place] ?03/28/2022 8:37 AM ?  ?

## 2022-03-27 NOTE — Progress Notes (Addendum)
Jennifer Acosta is a 31 y.o. G2P1001 at [redacted]w[redacted]d by LMP admitted for IOL/TOLAC due to [facility] 6/8 with low AFI (2cm). Due date 03/21/2022. ? ?Subjective: ? ?Patient is doing well with foley balloon still in place. Attempting to rest, but rating pain at 7/10. Interested in epidural at some point in labor.  ? ?Objective: ?BP 120/80 (BP Location: Left Arm)   Pulse 68   Temp 98.8 ?F (37.1 ?C) (Oral)   Resp 16   Ht 5\' 1"  (1.549 m)   Wt 74.9 kg   BMI 31.20 kg/m?  ?No intake/output data recorded. ?No intake/output data recorded. ? ?FHT:  FHR: 145 bpm, Variability: Moderate,  Accelerations:  Present,  Decelerations:  Early decelerations present  ?UC:   Regular, every 3-5 minutes ?SVE:   Dilation: 1.5 ?Effacement (%): 50 ?Station: -3 ?Exam by:: MD Higinio Plan ? ?Labs: ?Lab Results  ?Component Value Date  ? WBC 11.0 (H) 03/26/2022  ? HGB 11.8 (L) 03/26/2022  ? HCT 35.2 (L) 03/26/2022  ? MCV 96.2 03/26/2022  ? PLT 263 03/26/2022  ? ?Assessment / Plan: ?Augmentation of labor, progressing well ? ?Labor: Progressing well.  Foley balloon remains firmly in place.  Contracting regularly, rating them at a 7/10 in severity.  Will reassess at 0300 if balloon does not dislodge before then and assess position of balloon at that time (placed 1503 on 5/1). Will consider adding low dose Pitocin to aid with progress while balloon is in place.  ?Fetal Wellbeing:  Category I ?Pain Control:  IV pain meds now, planning for epidural when ready  ?I/D:  GBS negative  ?Anticipated MOD: VBAC ? ?Fontaine No, PA-S ?03/27/2022, 12:10 AM ? ?GME ATTESTATION:  ?I saw and evaluated the patient. I agree with the findings and the plan of care as documented in the student?s note. I have made changes to documentation as necessary. ? ?Vilma Meckel, MD ?OB Fellow, Faculty Practice ?[place] [facility] ?03/27/2022 12:52 AM ? ? ?

## 2022-03-28 ENCOUNTER — Encounter (HOSPITAL_COMMUNITY): Payer: Self-pay | Admitting: Family Medicine

## 2022-03-28 ENCOUNTER — Inpatient Hospital Stay (HOSPITAL_COMMUNITY): Payer: BC Managed Care – PPO

## 2022-03-28 LAB — BASIC METABOLIC PANEL
Anion gap: 11 (ref 5–15)
BUN: 13 mg/dL (ref 6–20)
CO2: 19 mmol/L — ABNORMAL LOW (ref 22–32)
Calcium: 7.7 mg/dL — ABNORMAL LOW (ref 8.9–10.3)
Chloride: 108 mmol/L (ref 98–111)
Creatinine, Ser: 1.41 mg/dL — ABNORMAL HIGH (ref 0.44–1.00)
GFR, Estimated: 51 mL/min — ABNORMAL LOW (ref >60)
Glucose, Bld: 125 mg/dL — ABNORMAL HIGH (ref 70–99)
Potassium: 4 mmol/L (ref 3.5–5.1)
Sodium: 138 mmol/L (ref 135–145)

## 2022-03-28 LAB — CBC
HCT: 30.3 % — ABNORMAL LOW (ref 36.0–46.0)
Hemoglobin: 10 g/dL — ABNORMAL LOW (ref 12.0–15.0)
MCH: 32.1 pg (ref 26.0–34.0)
MCHC: 33 g/dL (ref 30.0–36.0)
MCV: 97.1 fL (ref 80.0–100.0)
Platelets: 233 10*3/uL (ref 150–400)
RBC: 3.12 MIL/uL — ABNORMAL LOW (ref 3.87–5.11)
RDW: 14.8 % (ref 11.5–15.5)
WBC: 26.8 10*3/uL — ABNORMAL HIGH (ref 4.0–10.5)
nRBC: 0 % (ref 0.0–0.2)

## 2022-03-28 NOTE — Progress Notes (Signed)
POSTPARTUM PROGRESS NOTE ? ?POD #1 ? ?Subjective: ? ?Billye Pickerel is a 31 y.o. G2P2002 s/p rLTCS at [redacted]w[redacted]d. No acute events overnight. She reports she is doing well. She denies any problems with ambulating or po intake. Urine catheter still in, likely take out this afternoon if urine output reassuring. Denies nausea or vomiting. She has passed flatus. Pain is well controlled.  Lochia is mild. ? ?Objective: ?Blood pressure 101/60, pulse 88, temperature 97.9 ?F (36.6 ?C), temperature source Oral, resp. rate 18, height 5\' 1"  (1.549 m), weight 74.9 kg, SpO2 99 %, unknown if currently breastfeeding. ? ?Physical Exam:  ?General: alert, cooperative and no distress ?Chest: no respiratory distress ?Heart: regular rate, distal pulses intact ?Uterine Fundus: firm, appropriately tender ?DVT Evaluation: No calf swelling or tenderness ?Extremities: minimal edema ?Skin: warm, dry; incision clean/dry/intact w/ honeycomb dressing in place ? ?Recent Labs  ?  03/27/22 ?1304 03/28/22 ?0446  ?HGB 11.6* 10.0*  ?HCT 35.5* 30.3*  ? ? ?Assessment/Plan: ?Jillian Warth is a 31 y.o. 38 s/p pLTCS at [redacted]w[redacted]d. ? ?POD#1 - Doing welll; pain well controlled.  ? Routine postpartum care ? OOB, ambulated ? Lovenox for VTE prophylaxis ? ?Acute blood loss Anemia: asymptomatic ? ?#Triple I in labor: Amp/gent for 24 hours. Afebrile since 5/2 at 1430.  ? ?#AKI (in setting of urethral obstruction during labor): Cr 1.44>1.41 today. Fortunately good UOP since delivery. Will monitor and recheck BMP tomorrow.  ? ?Contraception: unsure  ?Feeding: breast ? ?Dispo: Plan for discharge tomorrow or the following day. ? ? LOS: 2 days  ? ?7/2, DO ?OB Fellow  ?03/28/2022, 1:47 PM  ?

## 2022-03-28 NOTE — Anesthesia Postprocedure Evaluation (Signed)
Anesthesia Post Note ? ?Patient: Jennifer Acosta ? ?Procedure(s) Performed: CESAREAN SECTION ? ?  ? ?Patient location during evaluation: PACU ?Anesthesia Type: Epidural ?Level of consciousness: oriented and awake and alert ?Pain management: pain level controlled ?Vital Signs Assessment: post-procedure vital signs reviewed and stable ?Respiratory status: spontaneous breathing, respiratory function stable and patient connected to nasal cannula oxygen ?Cardiovascular status: blood pressure returned to baseline and stable ?Postop Assessment: no headache, no backache and no apparent nausea or vomiting ?Anesthetic complications: no ? ? ?No notable events documented. ? ?Last Vitals:  ?Vitals:  ? 03/27/22 2228 03/27/22 2340  ?BP: 113/71 106/75  ?Pulse: 79 77  ?Resp: 16 18  ?Temp: 36.5 ?C 36.9 ?C  ?SpO2: 97% 96%  ?  ?Last Pain:  ?Vitals:  ? 03/27/22 2340  ?TempSrc: Axillary  ?PainSc: 0-No pain  ? ?Pain Goal: Patients Stated Pain Goal: 1 (03/27/22 0534) ? ?  ?  ?  ?  ?  ?  ?Epidural/Spinal Function Cutaneous sensation: Normal sensation (03/27/22 2340), Patient able to flex knees: Yes (03/27/22 2340), Patient able to lift hips off bed: Yes (03/27/22 2340), Back pain beyond tenderness at insertion site: No (03/27/22 2340), Progressively worsening motor and/or sensory loss: No (03/27/22 2340), Bowel and/or bladder incontinence post epidural: No (03/27/22 2340) ? ?Jarmon Javid L Fountain [place] ? ? ? ? ?

## 2022-03-28 NOTE — Lactation Note (Signed)
This note was copied from a baby's chart. ?Lactation Consultation Note ? ?Patient Name: Boy Jazyiah Yiu ?Today's Date: 03/28/2022 ?Reason for consult: Initial assessment;Term;Breast augmentation ? ?Age:32 hours ? ?Mom is a P2 who nursed her 1st child (now 28 yo) for 8 months. She had a breast augmentation 6-7 yrs ago. She reports a good milk supply when nursing her 1st child. ? ?Maternal Data ?Has patient been taught Hand Expression?: Yes ?Does the patient have breastfeeding experience prior to this delivery?: Yes ?How long did the patient breastfeed?: 8 months ? ?Interventions ?Interventions: Adjust position;Assisted with latch;Support pillows;Education;Position options;Breast compression ? ? ? ?Lurline Hare [place] ?03/28/2022, 8:12 AM ? ? ? ?

## 2022-03-29 LAB — BASIC METABOLIC PANEL
Anion gap: 9 (ref 5–15)
BUN: 12 mg/dL (ref 6–20)
CO2: 23 mmol/L (ref 22–32)
Calcium: 8.1 mg/dL — ABNORMAL LOW (ref 8.9–10.3)
Chloride: 108 mmol/L (ref 98–111)
Creatinine, Ser: 0.97 mg/dL (ref 0.44–1.00)
GFR, Estimated: >60 mL/min (ref >60)
Glucose, Bld: 63 mg/dL — ABNORMAL LOW (ref 70–99)
Potassium: 3.5 mmol/L (ref 3.5–5.1)
Sodium: 140 mmol/L (ref 135–145)

## 2022-03-29 MED ORDER — NORETHINDRONE 0.35 MG PO TABS: 1.0000 | ORAL_TABLET | Freq: Every day | ORAL | 5 refills | Status: AC

## 2022-03-29 MED ORDER — OXYCODONE HCL 5 MG PO TABS
5.0000 mg | ORAL_TABLET | Freq: Four times a day (QID) | ORAL | 0 refills | Status: AC | PRN
Start: 2022-03-29 — End: ?

## 2022-03-29 MED ORDER — IBUPROFEN 600 MG PO TABS: 600.0000 mg | ORAL_TABLET | Freq: Four times a day (QID) | ORAL | 0 refills | Status: AC | PRN

## 2022-03-29 MED ORDER — ACETAMINOPHEN 500 MG PO TABS: 1000.0000 mg | ORAL_TABLET | Freq: Three times a day (TID) | ORAL | 0 refills | Status: AC | PRN

## 2022-03-30 LAB — SURGICAL PATHOLOGY

## 2022-04-04 ENCOUNTER — Ambulatory Visit (INDEPENDENT_AMBULATORY_CARE_PROVIDER_SITE_OTHER): Payer: BC Managed Care – PPO

## 2022-04-04 VITALS — BP 122/83 | HR 80 | Wt 141.6 lb

## 2022-04-04 DIAGNOSIS — Z5189 Encounter for other specified aftercare: Secondary | ICD-10-CM

## 2022-04-04 NOTE — Progress Notes (Signed)
Patient is here today for an incision check. Incision site observed to be open to air. Incision site assessed and found to be clean, dry and intact. No redness, swelling, bruising or drainage noted. I reviewed signs and symptoms of infection with patient. Patient states she will have her postpartum appointment at [facility]. Patient denies any other questions or concerns.  ? ?Paulina Fusi, RN ?04/04/22 ?

## 2024-12-10 ENCOUNTER — Other Ambulatory Visit: Payer: Self-pay

## 2024-12-10 ENCOUNTER — Encounter: Payer: Self-pay | Admitting: Obstetrics and Gynecology

## 2024-12-10 ENCOUNTER — Ambulatory Visit: Admitting: Obstetrics and Gynecology

## 2024-12-10 VITALS — BP 134/92 | HR 111 | Wt 136.0 lb

## 2024-12-10 DIAGNOSIS — N939 Abnormal uterine and vaginal bleeding, unspecified: Secondary | ICD-10-CM

## 2024-12-10 NOTE — Progress Notes (Signed)
 "  NEW GYNECOLOGY PATIENT Patient name: Jennifer Acosta MRN [id number]  Date of birth: Aug 10, 1991 Chief Complaint:   Gynecologic Exam     History:  Juanetta Negash presents for prolonged menses. Reports menses for 13 days and passed a clot, and this is the first time it has happened. Moderate flow as it is typically. Stopped birth control pills (POP) about 5 months ago. No pain with the bleeding, just longer than normal. Reports last pap about 2 years ago. That period was last month. Not much heavier than typical period and passed a clot on the first day.  No new medication that had been started. No new stressors. No bowel changes. No urinary changes. No recent procedures. Not using any contraception and currently sexually active. No change in partner recently. Has been trying to get pregnant. The prolonged period was a little late. Had not taken a pregnancy test prior to the onset of bleeding. No pain with intercourse present. No nipple discharge. Bleeding had been intermittent during that time. No bleeding after intercourse.      Gynecologic History Patient's last menstrual period was 11/06/2024. Contraception: none   OB History  Gravida Para Term Preterm AB Living  2 2 2   2   SAB IAB Ectopic Multiple Live Births     0 2    # Outcome Date GA Lbr Len/2nd Weight Sex Type Anes PTL Lv  2 Term 03/27/22 [redacted]w[redacted]d  7 lb 2.6 oz (3.25 kg) M CS-LTranv EPI  LIV  1 Term 07/27/18 [redacted]w[redacted]d 09:01 / 03:23 6 lb 7.2 oz (2.925 kg) F CS-LTranv EPI  LIV     The following portions of the patient's history were reviewed and updated as appropriate: allergies, current medications, past family history, past medical history, past social history, past surgical history and problem list. Health Maintenance  Topic Date Due   DTaP/Tdap/Td vaccine (1 - Tdap) Never done   Hepatitis B Vaccine (1 of 3 - 19+ 3-dose series) Never done   Pap with HPV screening  Never done   Flu Shot  Never done   COVID-19 Vaccine (1  - 2025-26 season) Never done   HPV Vaccine (No Doses Required) Completed   Hepatitis C Screening  Completed   HIV Screening  Completed   Pneumococcal Vaccine  Aged Out   Meningitis B Vaccine  Aged Out     Review of Systems Pertinent items noted in HPI and remainder of comprehensive ROS otherwise negative.  Physical Exam:  BP (!) 134/92   Pulse (!) 111   Wt 136 lb (61.7 kg)   LMP 11/06/2024   BMI 25.70 kg/m  Physical Exam Vitals and nursing note reviewed.  Constitutional:      Appearance: Normal appearance.  Cardiovascular:     Rate and Rhythm: Normal rate.  Pulmonary:     Effort: Pulmonary effort is normal.     Breath sounds: Normal breath sounds.  Neurological:     General: No focal deficit present.     Mental Status: She is alert and oriented to person, place, and time.  Psychiatric:        Mood and Affect: Mood normal.        Behavior: Behavior normal.        Thought Content: Thought content normal.        Judgment: Judgment normal.        Assessment and Plan:  1. Abnormal uterine bleeding (AUB) (Primary) Noted that likely singular episode of abnormal bleeding, possibly due to  stress or chemical pregnancy. Will continue to monitor since typically menses are regular and currently trying to conceive. If abnormal pattern continues, recommend further evaluation.     Follow-up: No follow-ups on file.      Carter Quarry, MD Obstetrician & Gynecologist, [facility] [employer], [facility] "
